# Patient Record
Sex: Female | Born: 1955 | Race: Black or African American | Hispanic: No | Marital: Single | Smoking: Never smoker
Health system: Southern US, Community
[De-identification: ages and names within clinical notes are randomized; demographics above are authoritative.]

## PROBLEM LIST (undated history)

## (undated) DIAGNOSIS — E119 Type 2 diabetes mellitus without complications: Secondary | ICD-10-CM

## (undated) DIAGNOSIS — IMO0002 Reserved for concepts with insufficient information to code with codable children: Secondary | ICD-10-CM

## (undated) DIAGNOSIS — N950 Postmenopausal bleeding: Secondary | ICD-10-CM

## (undated) DIAGNOSIS — N840 Polyp of corpus uteri: Secondary | ICD-10-CM

## (undated) DIAGNOSIS — M549 Dorsalgia, unspecified: Secondary | ICD-10-CM

## (undated) DIAGNOSIS — I10 Essential (primary) hypertension: Secondary | ICD-10-CM

## (undated) DIAGNOSIS — K08109 Complete loss of teeth, unspecified cause, unspecified class: Secondary | ICD-10-CM

## (undated) DIAGNOSIS — K219 Gastro-esophageal reflux disease without esophagitis: Secondary | ICD-10-CM

## (undated) DIAGNOSIS — E785 Hyperlipidemia, unspecified: Secondary | ICD-10-CM

## (undated) DIAGNOSIS — Z973 Presence of spectacles and contact lenses: Secondary | ICD-10-CM

## (undated) DIAGNOSIS — G8929 Other chronic pain: Secondary | ICD-10-CM

## (undated) DIAGNOSIS — M1A9XX0 Chronic gout, unspecified, without tophus (tophi): Secondary | ICD-10-CM

## (undated) DIAGNOSIS — M1990 Unspecified osteoarthritis, unspecified site: Secondary | ICD-10-CM

## (undated) DIAGNOSIS — E669 Obesity, unspecified: Secondary | ICD-10-CM

## (undated) HISTORY — PX: CHOLECYSTECTOMY: SHX55

## (undated) HISTORY — PX: MANDIBLE FRACTURE SURGERY: SHX706

## (undated) HISTORY — PX: TONSILLECTOMY: SUR1361

## (undated) HISTORY — PX: HERNIA REPAIR: SHX51

---

## 1976-09-11 HISTORY — PX: TONSILLECTOMY: SUR1361

## 1980-09-11 HISTORY — PX: CHOLECYSTECTOMY OPEN: SUR202

## 1983-09-12 HISTORY — PX: MANDIBLE FRACTURE SURGERY: SHX706

## 1998-03-16 ENCOUNTER — Other Ambulatory Visit: Admission: RE | Admit: 1998-03-16 | Discharge: 1998-03-16 | Payer: Self-pay | Admitting: Gynecology

## 1998-03-18 ENCOUNTER — Other Ambulatory Visit: Admission: RE | Admit: 1998-03-18 | Discharge: 1998-03-18 | Payer: Self-pay | Admitting: Gynecology

## 2000-01-18 ENCOUNTER — Other Ambulatory Visit: Admission: RE | Admit: 2000-01-18 | Discharge: 2000-01-18 | Payer: Self-pay | Admitting: Gynecology

## 2001-01-10 ENCOUNTER — Other Ambulatory Visit: Admission: RE | Admit: 2001-01-10 | Discharge: 2001-01-10 | Payer: Self-pay | Admitting: Gynecology

## 2001-07-30 ENCOUNTER — Ambulatory Visit (HOSPITAL_COMMUNITY): Admission: RE | Admit: 2001-07-30 | Discharge: 2001-07-30 | Payer: Self-pay | Admitting: *Deleted

## 2001-07-30 ENCOUNTER — Encounter: Payer: Self-pay | Admitting: *Deleted

## 2001-08-07 ENCOUNTER — Inpatient Hospital Stay (HOSPITAL_COMMUNITY): Admission: AD | Admit: 2001-08-07 | Discharge: 2001-08-07 | Payer: Self-pay | Admitting: *Deleted

## 2001-08-15 ENCOUNTER — Inpatient Hospital Stay (HOSPITAL_COMMUNITY): Admission: AD | Admit: 2001-08-15 | Discharge: 2001-08-15 | Payer: Self-pay | Admitting: *Deleted

## 2001-08-16 ENCOUNTER — Inpatient Hospital Stay (HOSPITAL_COMMUNITY): Admission: AD | Admit: 2001-08-16 | Discharge: 2001-08-19 | Payer: Self-pay | Admitting: *Deleted

## 2001-08-16 ENCOUNTER — Encounter (INDEPENDENT_AMBULATORY_CARE_PROVIDER_SITE_OTHER): Payer: Self-pay

## 2001-12-19 ENCOUNTER — Other Ambulatory Visit: Admission: RE | Admit: 2001-12-19 | Discharge: 2001-12-19 | Payer: Self-pay | Admitting: *Deleted

## 2002-02-17 ENCOUNTER — Emergency Department (HOSPITAL_COMMUNITY): Admission: EM | Admit: 2002-02-17 | Discharge: 2002-02-17 | Payer: Self-pay | Admitting: Emergency Medicine

## 2002-12-24 ENCOUNTER — Other Ambulatory Visit: Admission: RE | Admit: 2002-12-24 | Discharge: 2002-12-24 | Payer: Self-pay | Admitting: *Deleted

## 2003-02-05 ENCOUNTER — Ambulatory Visit: Admission: RE | Admit: 2003-02-05 | Discharge: 2003-02-05 | Payer: Self-pay | Admitting: Family Medicine

## 2003-12-03 ENCOUNTER — Emergency Department (HOSPITAL_COMMUNITY): Admission: EM | Admit: 2003-12-03 | Discharge: 2003-12-03 | Payer: Self-pay | Admitting: Emergency Medicine

## 2004-01-07 ENCOUNTER — Other Ambulatory Visit: Admission: RE | Admit: 2004-01-07 | Discharge: 2004-01-07 | Payer: Self-pay | Admitting: Gynecology

## 2006-08-27 ENCOUNTER — Ambulatory Visit: Payer: Self-pay | Admitting: Gastroenterology

## 2007-06-24 ENCOUNTER — Encounter: Admission: RE | Admit: 2007-06-24 | Discharge: 2007-06-24 | Payer: Self-pay | Admitting: Family Medicine

## 2007-10-18 ENCOUNTER — Encounter: Payer: Self-pay | Admitting: Family Medicine

## 2007-10-18 ENCOUNTER — Ambulatory Visit (HOSPITAL_COMMUNITY): Admission: RE | Admit: 2007-10-18 | Discharge: 2007-10-18 | Payer: Self-pay | Admitting: Family Medicine

## 2008-03-25 ENCOUNTER — Emergency Department (HOSPITAL_COMMUNITY): Admission: EM | Admit: 2008-03-25 | Discharge: 2008-03-25 | Payer: Self-pay | Admitting: Emergency Medicine

## 2008-05-19 ENCOUNTER — Ambulatory Visit: Payer: Self-pay | Admitting: Family Medicine

## 2008-05-19 LAB — CONVERTED CEMR LAB
ALT: 28 units/L (ref 0–35)
AST: 34 units/L (ref 0–37)
Basophils Absolute: 0 10*3/uL (ref 0.0–0.1)
Basophils Relative: 0 % (ref 0–1)
Creatinine, Ser: 0.54 mg/dL (ref 0.40–1.20)
Eosinophils Relative: 3 % (ref 0–5)
Free T4: 1 ng/dL (ref 0.89–1.80)
HCT: 32.8 % — ABNORMAL LOW (ref 36.0–46.0)
Hemoglobin: 9.6 g/dL — ABNORMAL LOW (ref 12.0–15.0)
LDL Cholesterol: 55 mg/dL (ref 0–99)
MCHC: 29.3 g/dL — ABNORMAL LOW (ref 30.0–36.0)
Monocytes Absolute: 0.4 10*3/uL (ref 0.1–1.0)
Neutro Abs: 5.4 10*3/uL (ref 1.7–7.7)
Platelets: 438 10*3/uL — ABNORMAL HIGH (ref 150–400)
RDW: 17.1 % — ABNORMAL HIGH (ref 11.5–15.5)
TSH: 1.624 microintl units/mL (ref 0.350–4.50)
Total Bilirubin: 0.3 mg/dL (ref 0.3–1.2)
Total CHOL/HDL Ratio: 4.1
VLDL: 47 mg/dL — ABNORMAL HIGH (ref 0–40)

## 2008-05-21 ENCOUNTER — Ambulatory Visit (HOSPITAL_COMMUNITY): Admission: RE | Admit: 2008-05-21 | Discharge: 2008-05-21 | Payer: Self-pay | Admitting: Family Medicine

## 2008-05-27 ENCOUNTER — Ambulatory Visit: Payer: Self-pay | Admitting: *Deleted

## 2008-05-27 ENCOUNTER — Ambulatory Visit: Payer: Self-pay | Admitting: Family Medicine

## 2008-07-09 ENCOUNTER — Emergency Department (HOSPITAL_COMMUNITY): Admission: EM | Admit: 2008-07-09 | Discharge: 2008-07-09 | Payer: Self-pay | Admitting: Family Medicine

## 2008-07-16 ENCOUNTER — Ambulatory Visit: Payer: Self-pay | Admitting: Family Medicine

## 2008-07-30 ENCOUNTER — Ambulatory Visit (HOSPITAL_COMMUNITY): Admission: RE | Admit: 2008-07-30 | Discharge: 2008-08-01 | Payer: Self-pay | Admitting: General Surgery

## 2008-07-30 HISTORY — PX: LAPAROSCOPIC ASSISTED VENTRAL HERNIA REPAIR: SHX6312

## 2008-11-02 ENCOUNTER — Emergency Department (HOSPITAL_COMMUNITY): Admission: EM | Admit: 2008-11-02 | Discharge: 2008-11-02 | Payer: Self-pay | Admitting: Emergency Medicine

## 2009-01-08 ENCOUNTER — Ambulatory Visit: Payer: Self-pay | Admitting: Family Medicine

## 2009-04-01 ENCOUNTER — Ambulatory Visit: Payer: Self-pay | Admitting: Internal Medicine

## 2009-07-22 ENCOUNTER — Ambulatory Visit: Payer: Self-pay | Admitting: Family Medicine

## 2009-11-27 ENCOUNTER — Emergency Department (HOSPITAL_COMMUNITY): Admission: EM | Admit: 2009-11-27 | Discharge: 2009-11-27 | Payer: Self-pay | Admitting: Family Medicine

## 2009-12-08 ENCOUNTER — Emergency Department (HOSPITAL_COMMUNITY): Admission: EM | Admit: 2009-12-08 | Discharge: 2009-12-08 | Payer: Self-pay | Admitting: Emergency Medicine

## 2009-12-23 ENCOUNTER — Ambulatory Visit: Payer: Self-pay | Admitting: Family Medicine

## 2010-04-12 ENCOUNTER — Ambulatory Visit: Payer: Self-pay | Admitting: Family Medicine

## 2010-04-12 LAB — CONVERTED CEMR LAB
ALT: 17 units/L (ref 0–35)
Basophils Absolute: 0 10*3/uL (ref 0.0–0.1)
CO2: 26 meq/L (ref 19–32)
Cholesterol: 145 mg/dL (ref 0–200)
Eosinophils Relative: 3 % (ref 0–5)
HCT: 41.3 % (ref 36.0–46.0)
LDL Cholesterol: 77 mg/dL (ref 0–99)
Lymphocytes Relative: 25 % (ref 12–46)
Neutro Abs: 5.7 10*3/uL (ref 1.7–7.7)
Neutrophils Relative %: 67 % (ref 43–77)
Platelets: 331 10*3/uL (ref 150–400)
RDW: 15.5 % (ref 11.5–15.5)
Sodium: 139 meq/L (ref 135–145)
Total Bilirubin: 0.3 mg/dL (ref 0.3–1.2)
Total Protein: 6.7 g/dL (ref 6.0–8.3)
VLDL: 29 mg/dL (ref 0–40)

## 2010-04-26 ENCOUNTER — Ambulatory Visit: Payer: Self-pay | Admitting: Family Medicine

## 2010-05-20 ENCOUNTER — Ambulatory Visit (HOSPITAL_COMMUNITY): Admission: RE | Admit: 2010-05-20 | Discharge: 2010-05-20 | Payer: Self-pay | Admitting: Internal Medicine

## 2010-07-10 ENCOUNTER — Emergency Department (HOSPITAL_COMMUNITY): Admission: EM | Admit: 2010-07-10 | Discharge: 2010-07-10 | Payer: Self-pay | Admitting: Family Medicine

## 2010-07-25 ENCOUNTER — Emergency Department (HOSPITAL_COMMUNITY): Admission: EM | Admit: 2010-07-25 | Discharge: 2010-07-25 | Payer: Self-pay | Admitting: Emergency Medicine

## 2010-08-02 ENCOUNTER — Ambulatory Visit (HOSPITAL_COMMUNITY): Admission: RE | Admit: 2010-08-02 | Discharge: 2010-08-02 | Payer: Self-pay | Admitting: Family Medicine

## 2010-10-13 ENCOUNTER — Encounter (INDEPENDENT_AMBULATORY_CARE_PROVIDER_SITE_OTHER): Payer: Self-pay | Admitting: Family Medicine

## 2010-12-27 LAB — POCT URINALYSIS DIP (DEVICE)
Glucose, UA: NEGATIVE mg/dL
Hgb urine dipstick: NEGATIVE
Nitrite: NEGATIVE

## 2011-01-24 NOTE — Op Note (Signed)
Tracy Byrd, Tracy Byrd                  ACCOUNT NO.:  [id number]   MEDICAL RECORD NO.:  [id number]          PATIENT TYPE:  OIB   LOCATION:  1532                         FACILITY:  [facility]   PHYSICIAN:  Adolph Pollack, M.D.DATE OF BIRTH:  1956-03-03   DATE OF PROCEDURE:  07/30/2008  DATE OF DISCHARGE:                               OPERATIVE REPORT   PREOPERATIVE DIAGNOSIS:  Ventral hernia.   POSTOPERATIVE DIAGNOSIS:  Ventral hernia.   PROCEDURE:  Laparoscopic ventral hernia repair with mesh.   SURGEON:  Edwin Cap. Zoila Shutter, M.D.   ASSISTANT:  Anselm Pancoast. Zachery Dakins, M.D.   ANESTHESIA:  General.   INDICATIONS:  This 55 year old female has had a right subcostal incision  for cholecystectomy in the past as well as a lower midline incision.  She had developed some abdominal discomfort in the epigastric region  with bloating.  CT scan was performed which demonstrated a significant  sized ventral hernia.  It is palpable as well.  She now presents for  repair.   TECHNIQUE:  She was seen in the holding area and then brought to the  operating room, placed supine on the operating table and general  anesthetic was administered.  A Foley catheter was inserted and the  abdominal wall was widely prepped and draped.  In the left upper  quadrant a small incision was made and using a 5 mm OptiView trocar I  gained access to the peritoneal cavity and created a pneumoperitoneum.  Following this I inspected the area directly under the trocar and saw no  evidence of bleeding, solid organ injury or hollow organ injury.  I  identified the hernia and noted that part of the omentum was up in the  hernia.  A 5 mm trocar was then placed in the left lateral mid abdomen.  Using the Harmonic scalpel and blunt dissection I reduced the omentum  out of the hernia.  I then did dissect some adhesions between the right  upper quadrant incisional area using Harmonic scalpel and partially  divided the falciform  ligament.  A 10 mm trocar was then placed in the  right upper quadrant and a 5 mm in the right mid lateral abdomen.   The periphery of the mesh was measured with a spinal needle and then I  measured 4 cm away from this to have adequate overlap.  I brought a  piece of 15 cm x 20 cm Parietex mesh with a nonadherent barrier into the  field and placed four stay sutures at the four quadrants of the mesh of  #1 Novofil.  The mesh was hydrated, rolled up and then placed in the  abdominal cavity and then deployed with the nonadherent side facing the  viscera.  At the 12, 3, 6 and 9 o'clock positions I made stab incisions  and then brought up the stay sutures across fascial bridges and tied  them down initially anchoring the mesh to the abdominal wall.  Using the  spiral tacker I further anchored the mesh to the abdominal wall with an  outer rim and an inner rim of  tacks.  This provided for adequate  coverage of the defect with good overlap.   I then inspected all quadrants of the area and there was no bleeding or  evidence of visceral injury.  I removed the trocars and released the  CO2.  All skin incisions were closed with 4-0 Monocryl subcuticular  stitches.  Steri-Strips and sterile dressings were applied.  She  tolerated the procedure without any apparent complications and was taken  to the recovery room in satisfactory condition.      Adolph Pollack, M.D.  Electronically Signed     TJR/MEDQ  D:  07/30/2008  T:  07/30/2008  Job:  [id number]

## 2011-01-27 NOTE — Op Note (Signed)
[facility]  Patient:    Tracy Byrd, Tracy Byrd Visit Number: [id number] MRN: [id number]          Service Type: OBS Location: 910A 9199 01 Attending Physician:  Pleas Koch Dictated by:   Georgina Peer, M.D. Proc. Date: 08/16/01 Admit Date:  08/16/2001                             Operative Report  PREOPERATIVE DIAGNOSES:       1. Intrauterine pregnancy at 40-1/2 weeks.                               2. Spontaneous rupture of membranes with                                  meconium-stained fluid.                               3. Macrosomia.                               4. Nonreassuring fetal heart rate tracing.  POSTOPERATIVE DIAGNOSES:      1. Intrauterine pregnancy at 40-1/2 weeks.                               2. Spontaneous rupture of membranes with                                  meconium-stained fluid.                               3. Macrosomia.                               4. Nonreassuring fetal heart rate tracing.                               5. Low-lying posterior placenta.  OPERATION PERFORMED:          Primary low cervical transverse cesarean section.  SURGEON:                      Georgina Peer, M.D.  ANESTHESIA:                   Spinal.  ANESTHESIOLOGIST:             Raul Del, M.D.  BLOOD LOSS:                   1200 cc.  COMPLICATIONS:                None.  FINDINGS:                     A female infant born at 9:44.  Thin meconium-stained fluid, with some meconium below the cords.  Apgars were 5 at one minute and 9 at five minutes.  The tubes an ovaries were normal.  A fundal fibroid was noted.  INDICATIONS FOR PROCEDURE:    This 55 year old gravida 1, para 0 with an EDC of August 10, 2001 presented with rupture of membranes at approximately 6:45 a.m. with meconium-staine fluid.  She began contracting.  She was monitored in maternity admissions and note to have deep variable decelerations down to  30 beats per minute, which did not respond to positional change and oxygen administration.  The patient was fingertip dilated with the vertex unengaged. It was decided to perform a cesarean section for nonreassuring fetal heart rate tracing.  The patient was informed of the risks and complications of surgery including blood loss, infection, bowel or bladder injury, uterine injury, blood clots.  She accepted these and was willing to proceed.  DESCRIPTION OF PROCEDURE:     She was taken to the operating room, given a spinal anesthetic by Dr. Tacy Dura, prepped and draped in the normal sterile fashion.  A Foley catheter was placed in her bladder.  Urine was clear.  After adequate anesthesia was established, a transverse incision was made.  The peritoneal cavity was entered in a normal Pfannenstiel manner.  A bladder flap was made transversely and a bladder blade placed over the bladder flap.  A transverse uterine incision was made and the uterine cavity was entered. meconium-stained fluid which was not particulate (but was not real thin, either) was noted.  Bandage scissors widened the incision.  With fundal pressure, a female infant was delivered at 9:44.  The mouth and nose were suctioned.  A DeLee trap was present.  The infant started to cry.  Therefore, the cord was doubly clamped and cut.  The infant was handed to the pediatric team in attendance.  Cord blood was taken, as well as arterial cord pH.  The placenta was noted to be posterior and low in the lower uterine segment, with the fetal head resting on the placenta.  That may be the etiology of the fetal deceleration.  The placental membranes were removed.  The uterus was closed in two layers of running lock Vicryl with an imbricating suture of Vicryl. Bleeders were controlled with individual sutures of Vicryl.  The tubes an ovaries were inspected and found to be normal.  The pelvis was irrigated and all debris was removed.  The  underside of the peritoneum and muscles were inspected and any bleeders cauterized.  The fascia was closed with #1 Vicryl. The subcutaneous tissue was closed with 2-0 plain gut.  The skin was closed with skin staples.  The patient did receive one dose of Penicillin G for positive group B strep prior to the procedure, and also 1 g of Ancef after the cord was clamped.  Sponge, needle and instrument counts were correct.  The patient was transferred to the recovery area in stable condition.  Dictated by: Georgina Peer, M.D. Attending Physician:  Pleas Koch DD:  08/16/01 TD:  08/16/01 Job: [id number]

## 2011-01-27 NOTE — Discharge Summary (Signed)
[facility]  Patient:    Tracy Byrd, Tracy Byrd Visit Number: [id number] MRN: [id number]          Service Type: OBS Location: 910A 9113 01 Attending Physician:  Pleas Koch Dictated by:   Georgina Peer, M.D. Admit Date:  08/16/2001 Discharge Date: 08/19/2001                             Discharge Summary  ADMISSION DIAGNOSES: 1. Term intrauterine pregnancy. 2. Macrosomic infant. 3. Spontaneous rupture of membranes. 4. Non-reassuring fetal heart rate tracing.  DISCHARGE DIAGNOSES: 1. Term intrauterine pregnancy. 2. Macrosomic infant. 3. Spontaneous rupture of membranes. 4. Non-reassuring fetal heart rate tracing. 5. Anemia. 6. Female infant delivered.  HISTORY OF PRESENT ILLNESS:  The patient is a 55 year old, gravida 1, para 0, estimated date of confinement 08/10/01, who presented with spontaneous rupture of membranes in the early morning of 08/16/01.  Meconium stained fluid.  Fetal heart rate severe variable decelerations were noted in maternity admissions. It was not thought the infant was tolerating labor.  Corrective measures, including IV hydration, position changes, and oxygen did not correct the deep variables, and she was taken for cesarean section.  See cesarean section note for details.  HOSPITAL COURSE:  At the time of cesarean section, a female infant was born, 10 pounds 6 ounces.  Cord pH 7.22 arterial with Apgars of 5 and 9.  Thin meconium stained fluid was noted.  Some was noted below the cord which was suctioned by the neonatology team.  Blood loss was 1200 cc.  The patient otherwise did well.  Postoperatively, the patient did well.  Her blood pressure was 148/74.  She had adequate urine output, and a hemoglobin was 9.9 from preoperatively of 12.8.  She ambulated well.  There was no dizziness or lightheadedness.  Platelet count was 215,000.  On the second day she had a bowel movement and passed gas.  She was afebrile with  a blood pressure of 152/66.  The wound was clean and dry.  A repeat CBC showed a hemoglobin of 9.2.  She was not dizzy or light-headed.  Her blood pressure was 140/70.  Her wound was clean and dry.  Significant pedal edema had decreased somewhat, but was still 3+.  Reflexes were normal.  Abdomen was soft and flat.  She was voiding without difficulty.  She tolerated solids.  She was placed on Percocet p.o. for pain as well as Motrin, and on the third day she was ready for discharge.  She was discharged home with a discharge booklet, discharge instructions, prescriptions for Percocet.  FOLLOWUP:  In the office in about 4 days for staple removal.  She will also start iron b.i.d.  We will discuss contraception in the office.  ACTIVITY:  She will avoid heavy lifting, driving, and tub baths. Dictated by:   Georgina Peer, M.D. Attending Physician:  Pleas Koch DD:  08/19/01 TD:  08/19/01 Job: [id number]

## 2011-01-27 NOTE — Assessment & Plan Note (Signed)
[facility]                         GASTROENTEROLOGY OFFICE NOTE   NAME:Uliano, TITA TERHAAR                         MRN:          [id number]  DATE:08/27/2006                            DOB:          03-15-1956    REASON FOR CONSULTATION:  Indigestion.   Ms. Decoursey is a 55 year old African-American female here for evaluation  of indigestion.  She has been complaining of postprandial abdominal  bloating, mild discomfort and fullness in her abdomen.  She feels there  is a hardened area in the umbilical area after eating.  She has had some  vomiting which has since subsided.  She takes Naprosyn intermittently.  There has been no change in her medications.  She denies change of bowel  habits, melena, or hematochezia.  She was seen three years ago for heme  positive stool.  Colonoscopy was recommended but this was not done.   PAST MEDICAL HISTORY:  Pertinent for hypertension and arthritis.  She is  status post cholecystectomy and C-section.   FAMILY HISTORY:  Pertinent for a sister with stomach cancer and a  brother with esophageal cancer.   MEDICATIONS:  Include Benicar, Naprosyn, iron, estrogen, and oral  contraceptive.  She has no allergies.   She neither smokes nor drinks.  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   REVIEW OF SYSTEMS:  Positive for feet swelling, joint pains, night  sweats, anxiety, and shortness of breath.   PHYSICAL EXAMINATION:  GENERAL:  She is a healthy-appearing female.  VITAL SIGNS:  Pulse 80, blood pressure 126/84, weight 253.  HEENT: EOMI. PERRLA. Sclerae are anicteric.  Conjunctivae are pink.  NECK:  Supple without thyromegaly, adenopathy or carotid bruits.  CHEST:  Clear to auscultation and percussion without adventitious  sounds.  CARDIAC:  Regular rhythm; normal S1 S2.  There are no murmurs, gallops  or rubs.  ABDOMEN:  She has mild left lower quadrant tenderness without guarding  or rebound.  There were no abdominal  masses or organomegaly.  Bowel  sounds are normoactive.  EXTREMITIES:  Full range of motion.  No cyanosis, clubbing or edema.  RECTAL:  Deferred.   IMPRESSION:  1. Nonspecific dyspepsia - possibilities include active ulcer disease      or nonulcer dyspepsia.  2. History of heme positive stool.   RECOMMENDATION:  1. Trial of Zantac 150 mg b.i.d.  2. Upper endoscopy.  3. Colonoscopy (to be done at the same time as her upper endoscopy).     Barbette Hair. Arlyce Dice, MD,FACG  Electronically Signed    RDK/MedQ  DD: 08/27/2006  DT: 08/27/2006  Job #: [id number]   cc:   Onalee Hua L. Reed Breech, M.D.

## 2011-02-21 ENCOUNTER — Ambulatory Visit: Payer: Self-pay | Attending: Family Medicine | Admitting: Rehabilitative and Restorative Service Providers"

## 2011-02-21 DIAGNOSIS — R293 Abnormal posture: Secondary | ICD-10-CM | POA: Insufficient documentation

## 2011-02-21 DIAGNOSIS — M2569 Stiffness of other specified joint, not elsewhere classified: Secondary | ICD-10-CM | POA: Insufficient documentation

## 2011-02-21 DIAGNOSIS — M546 Pain in thoracic spine: Secondary | ICD-10-CM | POA: Insufficient documentation

## 2011-02-21 DIAGNOSIS — IMO0001 Reserved for inherently not codable concepts without codable children: Secondary | ICD-10-CM | POA: Insufficient documentation

## 2011-02-27 ENCOUNTER — Ambulatory Visit: Payer: Self-pay | Admitting: Physical Therapy

## 2011-03-02 ENCOUNTER — Ambulatory Visit: Payer: Self-pay | Admitting: Physical Therapy

## 2011-03-07 ENCOUNTER — Encounter: Payer: Self-pay | Admitting: Physical Therapy

## 2011-03-08 ENCOUNTER — Ambulatory Visit: Payer: Self-pay | Admitting: Physical Therapy

## 2011-03-09 ENCOUNTER — Ambulatory Visit: Payer: Self-pay | Admitting: Physical Therapy

## 2011-03-10 ENCOUNTER — Other Ambulatory Visit (HOSPITAL_COMMUNITY): Payer: Self-pay | Admitting: Family Medicine

## 2011-03-10 ENCOUNTER — Ambulatory Visit (HOSPITAL_COMMUNITY)
Admission: RE | Admit: 2011-03-10 | Discharge: 2011-03-10 | Disposition: A | Discharge status: Home / Self Care | Payer: Self-pay | Source: Ambulatory Visit | Attending: Family Medicine | Admitting: Family Medicine

## 2011-03-10 DIAGNOSIS — M5134 Other intervertebral disc degeneration, thoracic region: Secondary | ICD-10-CM

## 2011-03-10 DIAGNOSIS — R209 Unspecified disturbances of skin sensation: Secondary | ICD-10-CM | POA: Insufficient documentation

## 2011-03-10 DIAGNOSIS — M79609 Pain in unspecified limb: Secondary | ICD-10-CM | POA: Insufficient documentation

## 2011-03-10 DIAGNOSIS — M545 Low back pain: Secondary | ICD-10-CM

## 2011-03-10 DIAGNOSIS — M5137 Other intervertebral disc degeneration, lumbosacral region: Secondary | ICD-10-CM | POA: Insufficient documentation

## 2011-03-10 DIAGNOSIS — M25552 Pain in left hip: Secondary | ICD-10-CM

## 2011-03-10 DIAGNOSIS — M25559 Pain in unspecified hip: Secondary | ICD-10-CM | POA: Insufficient documentation

## 2011-03-10 DIAGNOSIS — M51379 Other intervertebral disc degeneration, lumbosacral region without mention of lumbar back pain or lower extremity pain: Secondary | ICD-10-CM | POA: Insufficient documentation

## 2011-03-21 ENCOUNTER — Ambulatory Visit: Payer: Self-pay | Attending: Family Medicine | Admitting: Physical Therapy

## 2011-03-21 DIAGNOSIS — M546 Pain in thoracic spine: Secondary | ICD-10-CM | POA: Insufficient documentation

## 2011-03-21 DIAGNOSIS — R293 Abnormal posture: Secondary | ICD-10-CM | POA: Insufficient documentation

## 2011-03-21 DIAGNOSIS — IMO0001 Reserved for inherently not codable concepts without codable children: Secondary | ICD-10-CM | POA: Insufficient documentation

## 2011-03-23 ENCOUNTER — Encounter: Payer: Self-pay | Admitting: Physical Therapy

## 2011-03-27 ENCOUNTER — Encounter: Payer: Self-pay | Admitting: Physical Therapy

## 2011-06-09 LAB — URINALYSIS, ROUTINE W REFLEX MICROSCOPIC
Nitrite: NEGATIVE
Specific Gravity, Urine: 1.018
Urobilinogen, UA: 1

## 2011-06-13 LAB — DIFFERENTIAL
Eosinophils Relative: 2
Lymphs Abs: 2.5
Monocytes Absolute: 0.6
Neutro Abs: 6

## 2011-06-13 LAB — COMPREHENSIVE METABOLIC PANEL
ALT: 33
AST: 35
Albumin: 3.4 — ABNORMAL LOW
CO2: 31
Chloride: 96
GFR calc Af Amer: >60
GFR calc non Af Amer: >60
Sodium: 136
Total Bilirubin: 0.7

## 2011-06-13 LAB — GLUCOSE, CAPILLARY
Glucose-Capillary: 119 — ABNORMAL HIGH
Glucose-Capillary: 121 — ABNORMAL HIGH
Glucose-Capillary: 130 — ABNORMAL HIGH
Glucose-Capillary: 140 — ABNORMAL HIGH
Glucose-Capillary: 142 — ABNORMAL HIGH
Glucose-Capillary: 158 — ABNORMAL HIGH
Glucose-Capillary: 163 — ABNORMAL HIGH
Glucose-Capillary: 163 — ABNORMAL HIGH

## 2011-06-13 LAB — CBC
RBC: 4.5
WBC: 9.4

## 2011-06-13 LAB — ABO/RH: ABO/RH(D): A POS

## 2011-06-13 LAB — PROTIME-INR
INR: 1
Prothrombin Time: 13.6

## 2011-06-30 ENCOUNTER — Ambulatory Visit: Payer: Self-pay | Admitting: Obstetrics and Gynecology

## 2011-10-04 ENCOUNTER — Emergency Department (INDEPENDENT_AMBULATORY_CARE_PROVIDER_SITE_OTHER)
Admission: EM | Admit: 2011-10-04 | Discharge: 2011-10-04 | Disposition: A | Discharge status: Home / Self Care | Payer: Self-pay | Source: Home / Self Care

## 2011-10-04 ENCOUNTER — Emergency Department (INDEPENDENT_AMBULATORY_CARE_PROVIDER_SITE_OTHER): Payer: Self-pay

## 2011-10-04 ENCOUNTER — Encounter (HOSPITAL_COMMUNITY): Payer: Self-pay

## 2011-10-04 DIAGNOSIS — J209 Acute bronchitis, unspecified: Secondary | ICD-10-CM

## 2011-10-04 HISTORY — DX: Essential (primary) hypertension: I10

## 2011-10-04 MED ORDER — PREDNISONE 20 MG PO TABS: 20.0000 mg | ORAL_TABLET | Freq: Two times a day (BID) | ORAL | Status: AC

## 2011-10-04 MED ORDER — BENZONATATE 100 MG PO CAPS: ORAL_CAPSULE | ORAL | Status: AC

## 2011-10-04 MED ORDER — ALBUTEROL SULFATE HFA 108 (90 BASE) MCG/ACT IN AERS
2.0000 | INHALATION_SPRAY | RESPIRATORY_TRACT | Status: DC | PRN
  Administered 2011-10-04: 2 via RESPIRATORY_TRACT

## 2011-10-04 MED ORDER — ALBUTEROL SULFATE HFA 108 (90 BASE) MCG/ACT IN AERS
INHALATION_SPRAY | RESPIRATORY_TRACT | Status: AC
  Filled 2011-10-04: qty 6.7

## 2011-10-04 MED ORDER — CEPHALEXIN 500 MG PO CAPS: 500.0000 mg | ORAL_CAPSULE | Freq: Three times a day (TID) | ORAL | Status: AC

## 2011-10-04 NOTE — ED Notes (Signed)
States she has been having cough since 07-28-2011, unable to shake it off; has been beige tinted, blood tinted today; minimal relief w OTC medications

## 2011-10-04 NOTE — ED Provider Notes (Signed)
Medical screening examination/treatment/procedure(s) were performed by non-physician practitioner and as supervising physician I was immediately available for consultation/collaboration.  LANEY,RONNIE   Ronnie Laney, MD 10/04/11 2111 

## 2011-10-04 NOTE — ED Provider Notes (Signed)
History     CSN: [id number]  Arrival date & time 10/04/11  1410   None     Chief Complaint  Patient presents with  . Cough    (Consider location/radiation/quality/duration/timing/severity/associated sxs/prior treatment) HPI Comments: Patients presents today with c/o cough x 2 months. Though cough has been waxing and waning but does not completely go away. Cough is mostly nonproductive but is occasionally productive with light colored sputum and today has been blood streaked. No shortness of breath or wheezing.    Past Medical History  Diagnosis Date  . Hypertension     Past Surgical History  Procedure Date  . Cholecystectomy   . Hernia repair   . Cesarean section     History reviewed. No pertinent family history.  History  Substance Use Topics  . Smoking status: Never Smoker   . Smokeless tobacco: Not on file  . Alcohol Use: No    OB History    Grav Para Term Preterm Abortions TAB SAB Ect Mult Living                  Review of Systems  Constitutional: Negative for fever, chills and fatigue.  HENT: Positive for congestion. Negative for ear pain, sore throat, rhinorrhea, sneezing, postnasal drip and sinus pressure.   Respiratory: Positive for cough. Negative for shortness of breath and wheezing.   Cardiovascular: Negative for chest pain and palpitations.    Allergies  Review of patient's allergies indicates no known allergies.  Home Medications   Current Outpatient Rx  Name Route Sig Dispense Refill  . BENZONATATE 100 MG PO CAPS  1-2 caps every 8 hrs prn cough 30 capsule 0  . CEPHALEXIN 500 MG PO CAPS Oral Take 1 capsule (500 mg total) by mouth 3 (three) times daily. 30 capsule 0  . PREDNISONE 20 MG PO TABS Oral Take 1 tablet (20 mg total) by mouth 2 (two) times daily. 10 tablet 0    BP 147/68  Pulse 87  Temp(Src) 98.4 F (36.9 C) (Oral)  Resp 20  SpO2 97%  LMP 08/26/2011  Physical Exam  Nursing note and vitals reviewed. Constitutional: She  appears well-developed and well-nourished. No distress.  HENT:  Head: Normocephalic and atraumatic.  Right Ear: Tympanic membrane, external ear and ear canal normal.  Left Ear: Tympanic membrane, external ear and ear canal normal.  Nose: Nose normal.  Mouth/Throat: Uvula is midline, oropharynx is clear and moist and mucous membranes are normal. No oropharyngeal exudate, posterior oropharyngeal edema or posterior oropharyngeal erythema.  Neck: Neck supple.  Cardiovascular: Normal rate, regular rhythm and normal heart sounds.   Pulmonary/Chest: Effort normal and breath sounds normal. No respiratory distress.  Lymphadenopathy:    She has no cervical adenopathy.  Neurological: She is alert.  Skin: Skin is warm and dry.  Psychiatric: She has a normal mood and affect.    ED Course  Procedures (including critical care time)  Labs Reviewed - No data to display Dg Chest 2 View  10/04/2011  *RADIOLOGY REPORT*  Clinical Data: Chronic cough, congestion  CHEST - 2 VIEW  Comparison: 07/28/2008  Findings: Lungs are essentially clear. No pleural effusion or pneumothorax.  Cardiomediastinal silhouette is within normal limits.  Degenerative changes of the visualized thoracolumbar spine.  IMPRESSION: No evidence of acute cardiopulmonary disease.  Original Report Authenticated By: Charline Bills, M.D.     1. Acute bronchitis       MDM  CXR neg. Cough - reportedly x 2 mos. Exam neg.  Melody Comas, [place] 10/04/11 1705

## 2011-10-10 ENCOUNTER — Encounter (HOSPITAL_COMMUNITY): Payer: Self-pay | Admitting: Emergency Medicine

## 2011-10-10 ENCOUNTER — Emergency Department (INDEPENDENT_AMBULATORY_CARE_PROVIDER_SITE_OTHER)
Admission: EM | Admit: 2011-10-10 | Discharge: 2011-10-10 | Disposition: A | Discharge status: Home / Self Care | Payer: Self-pay | Source: Home / Self Care | Attending: Family Medicine | Admitting: Family Medicine

## 2011-10-10 DIAGNOSIS — T07XXXA Unspecified multiple injuries, initial encounter: Secondary | ICD-10-CM

## 2011-10-10 HISTORY — DX: Other chronic pain: G89.29

## 2011-10-10 HISTORY — DX: Dorsalgia, unspecified: M54.9

## 2011-10-10 HISTORY — DX: Reserved for concepts with insufficient information to code with codable children: IMO0002

## 2011-10-10 MED ORDER — HYDROCODONE-ACETAMINOPHEN 5-500 MG PO TABS: 1.0000 | ORAL_TABLET | Freq: Four times a day (QID) | ORAL | Status: AC | PRN

## 2011-10-10 NOTE — ED Notes (Signed)
C/o back pain, hip pain, thigh pain, knee pain, reports fall saturday.  Tripped over a floor mat per patient

## 2011-10-10 NOTE — ED Provider Notes (Signed)
History     CSN: [id number]  Arrival date & time 10/10/11  4098   First MD Initiated Contact with Patient 10/10/11 0825      No chief complaint on file.   (Consider location/radiation/quality/duration/timing/severity/associated sxs/prior treatment) Patient is a 56 y.o. female presenting with fall. The history is provided by the patient.  Fall The accident occurred more than 2 days ago. The fall occurred while walking (tripped over mat in laundry matt, sl sore left thigh at the time, resolved, then soreness developed yest.). She landed on a hard floor. There was no blood loss. The pain is present in the left knee, left hip and left wrist. The pain is mild. She was ambulatory at the scene. The symptoms are aggravated by ambulation.    Past Medical History  Diagnosis Date  . Hypertension     Past Surgical History  Procedure Date  . Cholecystectomy   . Hernia repair   . Cesarean section     No family history on file.  History  Substance Use Topics  . Smoking status: Never Smoker   . Smokeless tobacco: Not on file  . Alcohol Use: No    OB History    Grav Para Term Preterm Abortions TAB SAB Ect Mult Living                  Review of Systems  Constitutional: Negative.   Gastrointestinal: Negative.   Musculoskeletal: Positive for arthralgias and gait problem. Negative for back pain and joint swelling.  Neurological: Negative.     Allergies  Review of patient's allergies indicates no known allergies.  Home Medications   Current Outpatient Rx  Name Route Sig Dispense Refill  . BENZONATATE 100 MG PO CAPS  1-2 caps every 8 hrs prn cough 30 capsule 0  . CEPHALEXIN 500 MG PO CAPS Oral Take 1 capsule (500 mg total) by mouth 3 (three) times daily. 30 capsule 0  . HYDROCODONE-ACETAMINOPHEN 5-500 MG PO TABS Oral Take 1-2 tablets by mouth every 6 (six) hours as needed for pain. 15 tablet 0  . PREDNISONE 20 MG PO TABS Oral Take 1 tablet (20 mg total) by mouth 2 (two) times  daily. 10 tablet 0    BP 165/80  Pulse 74  Temp(Src) 98.4 F (36.9 C) (Oral)  Resp 16  SpO2 97%  LMP 08/26/2011  Physical Exam  Nursing note and vitals reviewed. Constitutional: She is oriented to person, place, and time. She appears well-developed and well-nourished.  HENT:  Head: Normocephalic and atraumatic.  Neck: Normal range of motion. Neck supple.  Musculoskeletal: Normal range of motion. She exhibits tenderness.       Legs: Neurological: She is alert and oriented to person, place, and time.  Skin: Skin is warm and dry.       No bruises or abrasions    ED Course  Procedures (including critical care time)  Labs Reviewed - No data to display No results found.   1. Contusion of multiple sites       MDM          Barkley Bruns, MD 10/10/11 [phone]

## 2012-04-11 ENCOUNTER — Encounter (HOSPITAL_COMMUNITY): Payer: Self-pay | Admitting: Cardiology

## 2012-04-11 ENCOUNTER — Emergency Department (HOSPITAL_COMMUNITY)
Admission: EM | Admit: 2012-04-11 | Discharge: 2012-04-11 | Disposition: A | Discharge status: Home / Self Care | Payer: Self-pay | Attending: Emergency Medicine | Admitting: Emergency Medicine

## 2012-04-11 ENCOUNTER — Emergency Department (HOSPITAL_COMMUNITY): Payer: Self-pay

## 2012-04-11 DIAGNOSIS — IMO0002 Reserved for concepts with insufficient information to code with codable children: Secondary | ICD-10-CM | POA: Insufficient documentation

## 2012-04-11 DIAGNOSIS — M1711 Unilateral primary osteoarthritis, right knee: Secondary | ICD-10-CM

## 2012-04-11 DIAGNOSIS — G8929 Other chronic pain: Secondary | ICD-10-CM | POA: Insufficient documentation

## 2012-04-11 DIAGNOSIS — M1710 Unilateral primary osteoarthritis, unspecified knee: Secondary | ICD-10-CM | POA: Insufficient documentation

## 2012-04-11 DIAGNOSIS — I10 Essential (primary) hypertension: Secondary | ICD-10-CM | POA: Insufficient documentation

## 2012-04-11 MED ORDER — OXYCODONE-ACETAMINOPHEN 5-325 MG PO TABS: 1.0000 | ORAL_TABLET | Freq: Four times a day (QID) | ORAL | Status: AC | PRN

## 2012-04-11 NOTE — ED Notes (Signed)
Pt reports right knee and foot pain that started a couple of days ago. Denies any injury to the area.

## 2012-04-11 NOTE — ED Provider Notes (Signed)
History     CSN: [id number]  Arrival date & time 04/11/12  0816   First MD Initiated Contact with Patient 04/11/12 [phone]      Chief Complaint  Patient presents with  . Knee Pain    (Consider location/radiation/quality/duration/timing/severity/associated sxs/prior treatment) Patient is a 56 y.o. female presenting with knee pain. The history is provided by the patient.  Knee Pain This is a new problem. Episode onset: 3 days ago. The problem occurs constantly. The problem has been rapidly worsening. The symptoms are aggravated by walking. Nothing relieves the symptoms. Treatments tried: nsaids.    Past Medical History  Diagnosis Date  . Hypertension   . Back pain, chronic   . Degenerative disc disease     Past Surgical History  Procedure Date  . Cholecystectomy   . Hernia repair   . Cesarean section     History reviewed. No pertinent family history.  History  Substance Use Topics  . Smoking status: Never Smoker   . Smokeless tobacco: Not on file  . Alcohol Use: No    OB History    Grav Para Term Preterm Abortions TAB SAB Ect Mult Living                  Review of Systems  All other systems reviewed and are negative.    Allergies  Review of patient's allergies indicates no known allergies.  Home Medications   Current Outpatient Rx  Name Route Sig Dispense Refill  . CYCLOBENZAPRINE HCL 10 MG PO TABS Oral Take 10 mg by mouth at bedtime as needed. For pain    . GABAPENTIN 100 MG PO CAPS Oral Take 100 mg by mouth every evening.     . IBUPROFEN 800 MG PO TABS Oral Take 800 mg by mouth every 8 (eight) hours as needed. For pain    . LISINOPRIL-HYDROCHLOROTHIAZIDE 20-25 MG PO TABS Oral Take 1 tablet by mouth daily.    Marland Kitchen NAPROXEN 500 MG PO TABS Oral Take 500 mg by mouth 2 (two) times daily with a meal. Per MD, do not take with Ibuprofen    . TETRAHYDROZOLINE HCL 0.05 % OP SOLN Both Eyes Place 2 drops into both eyes daily as needed. For dry eyes or irritation       BP 142/87  Pulse 82  Temp 97.9 F (36.6 C) (Oral)  Resp 18  SpO2 100%  Physical Exam  Nursing note and vitals reviewed. Constitutional: She is oriented to person, place, and time. She appears well-developed and well-nourished. No distress.  HENT:  Head: Normocephalic and atraumatic.  Neck: Normal range of motion. Neck supple.  Musculoskeletal:       There is a mild to moderate effusion present.  There is pain with range of motion which makes exam difficult.  No warmth or erythema.  Neurological: She is alert and oriented to person, place, and time.  Skin: Skin is warm and dry. She is not diaphoretic.    ED Course  Procedures (including critical care time)  Labs Reviewed - No data to display No results found.   No diagnosis found.    MDM  The xrays show djd.  She will be treated with prednisone and pain meds.  To return prn if she worsens and follow up with pcp if not improving.        Geoffery Lyons, MD 04/11/12 1050

## 2012-08-19 ENCOUNTER — Encounter (HOSPITAL_COMMUNITY): Payer: Self-pay

## 2012-08-19 ENCOUNTER — Emergency Department (HOSPITAL_COMMUNITY)
Admission: EM | Admit: 2012-08-19 | Discharge: 2012-08-19 | Disposition: A | Discharge status: Home / Self Care | Payer: No Typology Code available for payment source | Source: Home / Self Care

## 2012-08-19 DIAGNOSIS — E119 Type 2 diabetes mellitus without complications: Secondary | ICD-10-CM

## 2012-08-19 DIAGNOSIS — M1990 Unspecified osteoarthritis, unspecified site: Secondary | ICD-10-CM

## 2012-08-19 DIAGNOSIS — I10 Essential (primary) hypertension: Secondary | ICD-10-CM

## 2012-08-19 DIAGNOSIS — M25569 Pain in unspecified knee: Secondary | ICD-10-CM

## 2012-08-19 LAB — LIPID PANEL
LDL Cholesterol: 87 mg/dL (ref 0–99)
Total CHOL/HDL Ratio: 4.4 RATIO
VLDL: 35 mg/dL (ref 0–40)

## 2012-08-19 LAB — HEMOGLOBIN A1C
Hgb A1c MFr Bld: 7 % — ABNORMAL HIGH (ref <5.7)
Mean Plasma Glucose: 154 mg/dL — ABNORMAL HIGH (ref <117)

## 2012-08-19 LAB — COMPREHENSIVE METABOLIC PANEL
ALT: 22 U/L (ref 0–35)
Albumin: 4 g/dL (ref 3.5–5.2)
Alkaline Phosphatase: 52 U/L (ref 39–117)
GFR calc Af Amer: >90 mL/min (ref >90)
Glucose, Bld: 113 mg/dL — ABNORMAL HIGH (ref 70–99)
Potassium: 3.6 mEq/L (ref 3.5–5.1)
Sodium: 137 mEq/L (ref 135–145)
Total Protein: 8.2 g/dL (ref 6.0–8.3)

## 2012-08-19 MED ORDER — LISINOPRIL-HYDROCHLOROTHIAZIDE 20-25 MG PO TABS: 1.0000 | ORAL_TABLET | Freq: Every day | ORAL | Status: AC

## 2012-08-19 MED ORDER — NAPROXEN 500 MG PO TABS: 500.0000 mg | ORAL_TABLET | Freq: Two times a day (BID) | ORAL | Status: DC

## 2012-08-19 MED ORDER — METFORMIN HCL 500 MG PO TABS: 500.0000 mg | ORAL_TABLET | Freq: Every day | ORAL | Status: DC

## 2012-08-19 MED ORDER — CYCLOBENZAPRINE HCL 10 MG PO TABS: 10.0000 mg | ORAL_TABLET | Freq: Every evening | ORAL | Status: DC | PRN

## 2012-08-19 MED ORDER — GABAPENTIN 100 MG PO CAPS: 100.0000 mg | ORAL_CAPSULE | Freq: Every evening | ORAL | Status: DC

## 2012-08-19 NOTE — ED Provider Notes (Signed)
History     CSN: [id number]  Arrival date & time 08/19/12  1036   None    Chief Complaint  Patient presents with  . Knee Pain  . Hip Pain  . Medication Refill    (Consider location/radiation/quality/duration/timing/severity/associated sxs/prior treatment) HPI Pt says she is out of her BP medications.  She reports that she otherwise has been doing well.  She has no complaints today.  She reports that she has had some difficulty checking her blood glucose because of not having testing strips.  She otherwise reports that she is taking her medications with no difficulties at this time.  She says that she has been without her gabapentin for the last several days.  Past Medical History  Diagnosis Date  . Hypertension   . Back pain, chronic   . Degenerative disc disease     Past Surgical History  Procedure Date  . Cholecystectomy   . Hernia repair   . Cesarean section     No family history on file.  History  Substance Use Topics  . Smoking status: Never Smoker   . Smokeless tobacco: Not on file  . Alcohol Use: No    OB History    Grav Para Term Preterm Abortions TAB SAB Ect Mult Living                  Review of Systems  Constitutional: Negative.   HENT: Negative.   Eyes: Positive for visual disturbance. Negative for pain, redness and itching.  Respiratory: Negative.   Cardiovascular: Negative.   Gastrointestinal: Negative.   Genitourinary: Negative.   Musculoskeletal: Negative.   Neurological: Negative.   Hematological: Negative.   Psychiatric/Behavioral: Negative.     Allergies  Review of patient's allergies indicates no known allergies.  Home Medications   Current Outpatient Rx  Name  Route  Sig  Dispense  Refill  . IBUPROFEN 800 MG PO TABS   Oral   Take 800 mg by mouth every 8 (eight) hours as needed. For pain         . METFORMIN HCL 500 MG PO TABS   Oral   Take 500 mg by mouth once. Pt states takes only 1/2 pill daily         . NAPROXEN  500 MG PO TABS   Oral   Take 500 mg by mouth 2 (two) times daily with a meal. Per MD, do not take with Ibuprofen         . CYCLOBENZAPRINE HCL 10 MG PO TABS   Oral   Take 10 mg by mouth at bedtime as needed. For pain         . GABAPENTIN 100 MG PO CAPS   Oral   Take 100 mg by mouth every evening.          Marland Kitchen LISINOPRIL-HYDROCHLOROTHIAZIDE 20-25 MG PO TABS   Oral   Take 1 tablet by mouth daily.         . TETRAHYDROZOLINE HCL 0.05 % OP SOLN   Both Eyes   Place 2 drops into both eyes daily as needed. For dry eyes or irritation           BP 108/56  Pulse 93  Temp 98.2 F (36.8 C) (Oral)  Resp 20  SpO2 100%  LMP 08/07/2012  Physical Exam  Nursing note and vitals reviewed. Constitutional: She is oriented to person, place, and time. She appears well-developed and well-nourished. No distress.  HENT:  Head: Normocephalic and atraumatic.  Eyes: EOM are normal. Pupils are equal, round, and reactive to light.  Neck: Normal range of motion. Neck supple. No JVD present. No thyromegaly present.  Cardiovascular: Normal rate, regular rhythm and normal heart sounds.   Pulmonary/Chest: Effort normal and breath sounds normal.  Abdominal: Soft. Bowel sounds are normal.  Musculoskeletal: Normal range of motion. She exhibits no edema and no tenderness.  Neurological: She is alert and oriented to person, place, and time. She has normal reflexes.  Skin: Skin is warm and dry. No rash noted. No erythema.  Psychiatric: She has a normal mood and affect. Her behavior is normal. Judgment and thought content normal.    ED Course  Procedures (including critical care time)  Labs Reviewed - No data to display No results found.   No diagnosis found.    MDM  IMPRESSION  Hypertension  Type 2 diabetes  Knee pain - chronic  Osteoarthritis  Chronic back pain  Diabetic neuropathy  RECOMMENDATIONS / PLAN Declined flu vaccine Refilled all of her blood pressure and diabetes  medications. Follow labs.  FOLLOW UP 3 months for regular check or sooner if necessary  The patient was given clear instructions to go to ER or return to medical center if symptoms don't improve, worsen or new problems develop.  The patient verbalized understanding.  The patient was told to call to get lab results if they haven't heard anything in the next week.           Cleora Fleet, MD 08/19/12 [phone]

## 2012-08-19 NOTE — ED Notes (Signed)
[facility]. Also c/o knee and hip pain

## 2012-08-19 NOTE — ED Notes (Deleted)
Patient states has been having abd pain since last February.  Denies diarrhea. Burning sensation when having bowel movement.  Stomach hurts after eating.  States presently taking omeprazole

## 2012-08-21 ENCOUNTER — Telehealth (HOSPITAL_COMMUNITY): Payer: Self-pay

## 2012-08-21 NOTE — Telephone Encounter (Signed)
Message copied by Lestine Mount on Wed Aug 21, 2012  2:14 PM ------      Message from: Cleora Fleet      Created: Tue Aug 20, 2012  8:12 PM       Please notify patient that labs came back OK and stable.  A1c is 7.0%.  Continue current meds and recheck in 3 months.              Rodney Langton, MD, CDE, FAAFP      [facility]      [facility]      [place], [place]

## 2012-12-31 ENCOUNTER — Ambulatory Visit (HOSPITAL_COMMUNITY)
Admission: RE | Admit: 2012-12-31 | Discharge: 2012-12-31 | Disposition: A | Discharge status: Home / Self Care | Payer: No Typology Code available for payment source | Source: Ambulatory Visit | Attending: Family Medicine | Admitting: Family Medicine

## 2012-12-31 ENCOUNTER — Other Ambulatory Visit (HOSPITAL_COMMUNITY): Payer: Self-pay | Admitting: Family Medicine

## 2012-12-31 DIAGNOSIS — M238X9 Other internal derangements of unspecified knee: Secondary | ICD-10-CM | POA: Insufficient documentation

## 2012-12-31 DIAGNOSIS — M25569 Pain in unspecified knee: Secondary | ICD-10-CM | POA: Insufficient documentation

## 2012-12-31 DIAGNOSIS — R52 Pain, unspecified: Secondary | ICD-10-CM

## 2013-02-06 ENCOUNTER — Encounter (HOSPITAL_COMMUNITY): Payer: Self-pay | Admitting: *Deleted

## 2013-02-06 ENCOUNTER — Emergency Department (HOSPITAL_COMMUNITY)
Admission: EM | Admit: 2013-02-06 | Discharge: 2013-02-06 | Disposition: A | Discharge status: Home / Self Care | Payer: Self-pay | Attending: Emergency Medicine | Admitting: Emergency Medicine

## 2013-02-06 DIAGNOSIS — Z79899 Other long term (current) drug therapy: Secondary | ICD-10-CM | POA: Insufficient documentation

## 2013-02-06 DIAGNOSIS — Z9089 Acquired absence of other organs: Secondary | ICD-10-CM | POA: Insufficient documentation

## 2013-02-06 DIAGNOSIS — M5432 Sciatica, left side: Secondary | ICD-10-CM

## 2013-02-06 DIAGNOSIS — M545 Low back pain, unspecified: Secondary | ICD-10-CM | POA: Insufficient documentation

## 2013-02-06 DIAGNOSIS — Z8739 Personal history of other diseases of the musculoskeletal system and connective tissue: Secondary | ICD-10-CM | POA: Insufficient documentation

## 2013-02-06 DIAGNOSIS — I10 Essential (primary) hypertension: Secondary | ICD-10-CM | POA: Insufficient documentation

## 2013-02-06 DIAGNOSIS — G8929 Other chronic pain: Secondary | ICD-10-CM | POA: Insufficient documentation

## 2013-02-06 DIAGNOSIS — M5430 Sciatica, unspecified side: Secondary | ICD-10-CM | POA: Insufficient documentation

## 2013-02-06 LAB — URINALYSIS, ROUTINE W REFLEX MICROSCOPIC
Bilirubin Urine: NEGATIVE
Ketones, ur: NEGATIVE mg/dL
Nitrite: NEGATIVE
Protein, ur: NEGATIVE mg/dL
Urobilinogen, UA: 1 mg/dL (ref 0.0–1.0)

## 2013-02-06 MED ORDER — ONDANSETRON 4 MG PO TBDP
4.0000 mg | ORAL_TABLET | Freq: Once | ORAL | Status: AC
  Administered 2013-02-06: 4 mg via ORAL

## 2013-02-06 MED ORDER — HYDROMORPHONE HCL PF 1 MG/ML IJ SOLN
1.0000 mg | Freq: Once | INTRAMUSCULAR | Status: AC
  Administered 2013-02-06: 1 mg via INTRAMUSCULAR
  Filled 2013-02-06: qty 1

## 2013-02-06 MED ORDER — ONDANSETRON 4 MG PO TBDP
ORAL_TABLET | ORAL | Status: AC
  Administered 2013-02-06: 4 mg
  Filled 2013-02-06: qty 1

## 2013-02-06 MED ORDER — PROMETHAZINE HCL 25 MG/ML IJ SOLN
25.0000 mg | Freq: Once | INTRAMUSCULAR | Status: DC
  Filled 2013-02-06 (×2): qty 1

## 2013-02-06 MED ORDER — KETOROLAC TROMETHAMINE 60 MG/2ML IM SOLN
60.0000 mg | Freq: Once | INTRAMUSCULAR | Status: AC
  Administered 2013-02-06: 60 mg via INTRAMUSCULAR
  Filled 2013-02-06: qty 2

## 2013-02-06 MED ORDER — HYDROCODONE-ACETAMINOPHEN 5-325 MG PO TABS: 2.0000 | ORAL_TABLET | ORAL | Status: DC | PRN

## 2013-02-06 MED ORDER — DIAZEPAM 5 MG PO TABS: 5.0000 mg | ORAL_TABLET | Freq: Two times a day (BID) | ORAL | Status: DC

## 2013-02-06 NOTE — ED Notes (Signed)
Pain improved after IM injection, but reports nausea. PA made aware and given zofran ODT for relief.

## 2013-02-06 NOTE — ED Notes (Signed)
Pt vomited x 1- PA made aware and order received for phenergren.

## 2013-02-06 NOTE — ED Provider Notes (Signed)
Medical screening examination/treatment/procedure(s) were performed by non-physician practitioner and as supervising physician I was immediately available for consultation/collaboration.  Brelan Hannen M Capone Schwinn, MD 02/06/13 2228 

## 2013-02-06 NOTE — ED Notes (Signed)
Pt reports left flank/back pain radiating to left leg. Reports hx of back problems. Denies abdominal pain or difficulty urinating.

## 2013-02-06 NOTE — ED Provider Notes (Signed)
History     CSN: [id number]  Arrival date & time 02/06/13  4098   First MD Initiated Contact with Patient 02/06/13 [phone]      Chief Complaint  Patient presents with  . Flank Pain    (Consider location/radiation/quality/duration/timing/severity/associated sxs/prior treatment) Patient is a 57 y.o. female presenting with back pain. The history is provided by the patient. No language interpreter was used.  Back Pain Location:  Lumbar spine Quality:  Aching Radiates to:  L posterior upper leg Pain severity:  Moderate Pain is:  Same all the time Onset quality:  Gradual Timing:  Constant Context: not falling and not MVA   Relieved by:  Nothing Worsened by:  Nothing tried Ineffective treatments:  Ibuprofen and muscle relaxants Associated symptoms: no abdominal pain    Pt reports she has degenerative disc disease in her low back.   Pt reports she has worsening pain with pain going down her left leg. Past Medical History  Diagnosis Date  . Hypertension   . Back pain, chronic   . Degenerative disc disease     Past Surgical History  Procedure Laterality Date  . Cholecystectomy    . Hernia repair    . Cesarean section      No family history on file.  History  Substance Use Topics  . Smoking status: Never Smoker   . Smokeless tobacco: Not on file  . Alcohol Use: No    OB History   Grav Para Term Preterm Abortions TAB SAB Ect Mult Living                  Review of Systems  Gastrointestinal: Negative for abdominal pain.  Musculoskeletal: Positive for back pain.  All other systems reviewed and are negative.    Allergies  Tramadol  Home Medications   Current Outpatient Rx  Name  Route  Sig  Dispense  Refill  . cyclobenzaprine (FLEXERIL) 10 MG tablet   Oral   Take 1 tablet (10 mg total) by mouth at bedtime as needed. For pain   30 tablet   3   . gabapentin (NEURONTIN) 100 MG capsule   Oral   Take 1 capsule (100 mg total) by mouth every evening.   90  capsule   3   . ibuprofen (ADVIL,MOTRIN) 800 MG tablet   Oral   Take 800 mg by mouth every 8 (eight) hours as needed for pain.         Marland Kitchen lisinopril-hydrochlorothiazide (PRINZIDE,ZESTORETIC) 20-25 MG per tablet   Oral   Take 1 tablet by mouth daily.   30 tablet   3   . metFORMIN (GLUCOPHAGE) 500 MG tablet   Oral   Take 1 tablet (500 mg total) by mouth daily with breakfast. Pt states takes only 1/2 pill daily   30 tablet   3   . naproxen (NAPROSYN) 500 MG tablet   Oral   Take 500 mg by mouth 2 (two) times daily as needed. For knee pain           BP 145/75  Pulse 88  Temp(Src) 98.2 F (36.8 C) (Oral)  Resp 18  SpO2 98%  LMP 08/07/2012  Physical Exam  Nursing note and vitals reviewed. Constitutional: She is oriented to person, place, and time. She appears well-developed and well-nourished.  HENT:  Head: Normocephalic.  Right Ear: External ear normal.  Left Ear: External ear normal.  Nose: Nose normal.  Mouth/Throat: Oropharynx is clear and moist.  Eyes: Conjunctivae and EOM  are normal. Pupils are equal, round, and reactive to light.  Neck: Normal range of motion.  Cardiovascular: Normal rate and normal heart sounds.   Pulmonary/Chest: Effort normal and breath sounds normal.  Abdominal: Soft.  Musculoskeletal:  Tender left lower back,  Tender to left buttock  Neurological: She is alert and oriented to person, place, and time. She has normal reflexes.  Skin: Skin is warm.  Psychiatric: She has a normal mood and affect.    ED Course  Procedures (including critical care time)  Labs Reviewed - No data to display No results found.   1. Sciatica, left       MDM    Results for orders placed during the hospital encounter of 02/06/13  URINALYSIS, ROUTINE W REFLEX MICROSCOPIC      Result Value Range   Color, Urine YELLOW  YELLOW   APPearance CLEAR  CLEAR   Specific Gravity, Urine 1.015  1.005 - 1.030   pH 5.0  5.0 - 8.0   Glucose, UA NEGATIVE  NEGATIVE  mg/dL   Hgb urine dipstick TRACE (*) NEGATIVE   Bilirubin Urine NEGATIVE  NEGATIVE   Ketones, ur NEGATIVE  NEGATIVE mg/dL   Protein, ur NEGATIVE  NEGATIVE mg/dL   Urobilinogen, UA 1.0  0.0 - 1.0 mg/dL   Nitrite NEGATIVE  NEGATIVE   Leukocytes, UA NEGATIVE  NEGATIVE  URINE MICROSCOPIC-ADD ON      Result Value Range   Squamous Epithelial / LPF RARE  RARE   WBC, UA 0-2  <3 WBC/hpf   RBC / HPF 0-2  <3 RBC/hpf   Bacteria, UA FEW (*) RARE   No results found.     Pt had nausea and vomiting after medications  Pt given odt zofran.  I suspect sciatica.   Pt advised to follow up with her primary MD for recheck  Elson Areas, PA-C 02/06/13 1241  Lonia Skinner [place], [place] 02/06/13 1241

## 2013-02-11 ENCOUNTER — Other Ambulatory Visit (HOSPITAL_COMMUNITY): Payer: Self-pay | Admitting: Family Medicine

## 2013-02-11 DIAGNOSIS — Z1231 Encounter for screening mammogram for malignant neoplasm of breast: Secondary | ICD-10-CM

## 2013-02-13 ENCOUNTER — Ambulatory Visit (HOSPITAL_COMMUNITY): Payer: No Typology Code available for payment source

## 2013-03-04 ENCOUNTER — Ambulatory Visit (HOSPITAL_COMMUNITY): Payer: No Typology Code available for payment source | Attending: Family Medicine

## 2013-06-07 ENCOUNTER — Encounter (HOSPITAL_COMMUNITY): Payer: Self-pay | Admitting: Emergency Medicine

## 2013-06-07 ENCOUNTER — Emergency Department (HOSPITAL_COMMUNITY)
Admission: EM | Admit: 2013-06-07 | Discharge: 2013-06-07 | Disposition: A | Discharge status: Home / Self Care | Payer: No Typology Code available for payment source | Attending: Emergency Medicine | Admitting: Emergency Medicine

## 2013-06-07 DIAGNOSIS — IMO0002 Reserved for concepts with insufficient information to code with codable children: Secondary | ICD-10-CM | POA: Insufficient documentation

## 2013-06-07 DIAGNOSIS — I10 Essential (primary) hypertension: Secondary | ICD-10-CM | POA: Insufficient documentation

## 2013-06-07 DIAGNOSIS — E119 Type 2 diabetes mellitus without complications: Secondary | ICD-10-CM | POA: Insufficient documentation

## 2013-06-07 DIAGNOSIS — Z79899 Other long term (current) drug therapy: Secondary | ICD-10-CM | POA: Insufficient documentation

## 2013-06-07 DIAGNOSIS — M546 Pain in thoracic spine: Secondary | ICD-10-CM | POA: Insufficient documentation

## 2013-06-07 DIAGNOSIS — M79609 Pain in unspecified limb: Secondary | ICD-10-CM | POA: Insufficient documentation

## 2013-06-07 DIAGNOSIS — E669 Obesity, unspecified: Secondary | ICD-10-CM | POA: Insufficient documentation

## 2013-06-07 DIAGNOSIS — Z888 Allergy status to other drugs, medicaments and biological substances status: Secondary | ICD-10-CM | POA: Insufficient documentation

## 2013-06-07 DIAGNOSIS — G8929 Other chronic pain: Secondary | ICD-10-CM | POA: Insufficient documentation

## 2013-06-07 HISTORY — DX: Obesity, unspecified: E66.9

## 2013-06-07 HISTORY — DX: Type 2 diabetes mellitus without complications: E11.9

## 2013-06-07 MED ORDER — HYDROCODONE-ACETAMINOPHEN 5-325 MG PO TABS: ORAL_TABLET | ORAL | Status: DC

## 2013-06-07 MED ORDER — MORPHINE SULFATE 4 MG/ML IJ SOLN
4.0000 mg | Freq: Once | INTRAMUSCULAR | Status: AC
  Administered 2013-06-07: 4 mg via INTRAMUSCULAR
  Filled 2013-06-07: qty 1

## 2013-06-07 MED ORDER — PROMETHAZINE HCL 25 MG PO TABS: 25.0000 mg | ORAL_TABLET | Freq: Four times a day (QID) | ORAL | Status: DC | PRN

## 2013-06-07 MED ORDER — ONDANSETRON 4 MG PO TBDP
4.0000 mg | ORAL_TABLET | Freq: Once | ORAL | Status: AC
  Administered 2013-06-07: 4 mg via ORAL
  Filled 2013-06-07: qty 1

## 2013-06-07 NOTE — ED Notes (Signed)
Pt. reports right back pain onset Tuesday , denies injury or fall , pt. stated history of degenerative disck disease and arthritis .

## 2013-06-07 NOTE — ED Provider Notes (Signed)
CSN: [id number]     Arrival date & time 06/07/13  2047 History  This chart was scribed for non-physician practitioner Wynetta Emery, PA-C working with Gwyneth Sprout, MD by Danella Maiers, ED Scribe. This patient was seen in room TR11C/TR11C and the patient's care was started at 8:55 PM.   Chief Complaint  Patient presents with  . Back Pain   Patient is a 57 y.o. female presenting with back pain. The history is provided by the patient. No language interpreter was used.  Back Pain  HPI Comments: Tracy Byrd is a 57 y.o. female with a history of degenerative disc disease, arthritis, and chronic back pain who presents to the Emergency Department complaining of right-sided thoracic back pain with left leg involvement onset 4 days ago. She states when she got out of the car the pain radiated down the back of her left leg. She is taking ibuprofen at home with no relief. She states the pain is made worse by walking. She rates her pain as a 10/10 severity. She went to the doctor 3 days ago and was told that her pain is secondary to her degenerative disc disease. She has not seen an orthopedist. She denies any injuries or falls. She denies fever, urinary or bowel problems. She denies h/o cancer. She denies h/o IV drug use.  Past Medical History  Diagnosis Date  . Hypertension   . Back pain, chronic   . Degenerative disc disease    Past Surgical History  Procedure Laterality Date  . Cholecystectomy    . Hernia repair    . Cesarean section     No family history on file. History  Substance Use Topics  . Smoking status: Never Smoker   . Smokeless tobacco: Not on file  . Alcohol Use: No   OB History   Grav Para Term Preterm Abortions TAB SAB Ect Mult Living                 Review of Systems  Musculoskeletal: Positive for back pain.  A complete 10 system review of systems was obtained and all systems are negative except as noted in the HPI and PMH.   Allergies  Tramadol  Home  Medications   Current Outpatient Rx  Name  Route  Sig  Dispense  Refill  . cyclobenzaprine (FLEXERIL) 10 MG tablet   Oral   Take 1 tablet (10 mg total) by mouth at bedtime as needed. For pain   30 tablet   3   . diazepam (VALIUM) 5 MG tablet   Oral   Take 1 tablet (5 mg total) by mouth 2 (two) times daily.   10 tablet   0   . gabapentin (NEURONTIN) 100 MG capsule   Oral   Take 1 capsule (100 mg total) by mouth every evening.   90 capsule   3   . HYDROcodone-acetaminophen (NORCO/VICODIN) 5-325 MG per tablet   Oral   Take 2 tablets by mouth every 4 (four) hours as needed for pain.   20 tablet   0   . ibuprofen (ADVIL,MOTRIN) 800 MG tablet   Oral   Take 800 mg by mouth every 8 (eight) hours as needed for pain.         Marland Kitchen lisinopril-hydrochlorothiazide (PRINZIDE,ZESTORETIC) 20-25 MG per tablet   Oral   Take 1 tablet by mouth daily.   30 tablet   3   . metFORMIN (GLUCOPHAGE) 500 MG tablet   Oral   Take 1 tablet (  500 mg total) by mouth daily with breakfast. Pt states takes only 1/2 pill daily   30 tablet   3   . naproxen (NAPROSYN) 500 MG tablet   Oral   Take 500 mg by mouth 2 (two) times daily as needed. For knee pain          BP 151/66  Pulse 87  Temp(Src) 98.3 F (36.8 C) (Oral)  Resp 16  SpO2 98%  LMP 08/07/2012 Physical Exam  Nursing note and vitals reviewed. Constitutional: She is oriented to person, place, and time. She appears well-developed and well-nourished. No distress.  HENT:  Head: Normocephalic.  Eyes: Conjunctivae and EOM are normal.  Cardiovascular: Normal rate.   Pulmonary/Chest: Effort normal. No stridor.  Musculoskeletal: Normal range of motion.  Strength is 5 out of 5 to bilateral lower extremities at hip and knee, extensor hallucis longus 5 out of 5. Ankle strength 5 out of 5, no clonus, neurovascularly intact.   Neurological: She is alert and oriented to person, place, and time.  Psychiatric: She has a normal mood and affect.     ED Course  Procedures (including critical care time) Medications - No data to display  DIAGNOSTIC STUDIES: Oxygen Saturation is 98% on RA, normal by my interpretation.    COORDINATION OF CARE: 9:10 PM- Discussed treatment plan with pt which includes treatment with pain meds and referral to orthopedist and pt agrees to plan.    Labs Review Labs Reviewed - No data to display Imaging Review No results found.  MDM   1. Exacerbation of chronic back pain     Filed Vitals:   06/07/13 2050 06/07/13 2156  BP: 151/66 143/71  Pulse: 87 77  Temp: 98.3 F (36.8 C) 98.4 F (36.9 C)  TempSrc: Oral Oral  Resp: 16 16  SpO2: 98% 97%     Tracy Byrd is a 57 y.o. female Patient with back pain.  No neurological deficits and normal neuro exam.  Patient can walk but states is painful.  No loss of bowel or bladder control.  No concern for cauda equina.  No fever, night sweats, weight loss, h/o cancer, IVDU.  RICE protocol and pain medicine indicated and discussed with patient.    Medications  morphine 4 MG/ML injection 4 mg (4 mg Intramuscular Given 06/07/13 2126)  ondansetron (ZOFRAN-ODT) disintegrating tablet 4 mg (4 mg Oral Given 06/07/13 2126)    Pt is hemodynamically stable, appropriate for, and amenable to discharge at this time. Pt verbalized understanding and agrees with care plan. All questions answered. Outpatient follow-up and specific return precautions discussed.    Discharge Medication List as of 06/07/2013  9:18 PM    START taking these medications   Details  !! HYDROcodone-acetaminophen (NORCO/VICODIN) 5-325 MG per tablet Take 1-2 tablets by mouth every 6 hours as needed for pain., Print    promethazine (PHENERGAN) 25 MG tablet Take 1 tablet (25 mg total) by mouth every 6 (six) hours as needed for nausea., Starting 06/07/2013, Until Discontinued, Print     !! - Potential duplicate medications found. Please discuss with provider.      Note: Portions of this report  may have been transcribed using voice recognition software. Every effort was made to ensure accuracy; however, inadvertent computerized transcription errors may be present     Wynetta Emery, PA-C 06/08/13 2207

## 2013-06-07 NOTE — ED Notes (Signed)
Pt has ride home.

## 2013-06-08 NOTE — ED Provider Notes (Signed)
Medical screening examination/treatment/procedure(s) were performed by non-physician practitioner and as supervising physician I was immediately available for consultation/collaboration.   Maekayla Giorgio, MD 06/08/13 2328 

## 2013-08-16 ENCOUNTER — Emergency Department (HOSPITAL_COMMUNITY)
Admission: EM | Admit: 2013-08-16 | Discharge: 2013-08-16 | Disposition: A | Discharge status: Home / Self Care | Payer: Self-pay | Attending: Emergency Medicine | Admitting: Emergency Medicine

## 2013-08-16 ENCOUNTER — Encounter (HOSPITAL_COMMUNITY): Payer: Self-pay | Admitting: Emergency Medicine

## 2013-08-16 DIAGNOSIS — M545 Low back pain, unspecified: Secondary | ICD-10-CM | POA: Insufficient documentation

## 2013-08-16 DIAGNOSIS — G8929 Other chronic pain: Secondary | ICD-10-CM | POA: Insufficient documentation

## 2013-08-16 DIAGNOSIS — M549 Dorsalgia, unspecified: Secondary | ICD-10-CM

## 2013-08-16 DIAGNOSIS — Z79899 Other long term (current) drug therapy: Secondary | ICD-10-CM | POA: Insufficient documentation

## 2013-08-16 DIAGNOSIS — I10 Essential (primary) hypertension: Secondary | ICD-10-CM | POA: Insufficient documentation

## 2013-08-16 DIAGNOSIS — E119 Type 2 diabetes mellitus without complications: Secondary | ICD-10-CM | POA: Insufficient documentation

## 2013-08-16 DIAGNOSIS — E669 Obesity, unspecified: Secondary | ICD-10-CM | POA: Insufficient documentation

## 2013-08-16 MED ORDER — HYDROCODONE-ACETAMINOPHEN 5-325 MG PO TABS: 1.0000 | ORAL_TABLET | Freq: Four times a day (QID) | ORAL | Status: DC | PRN

## 2013-08-16 NOTE — ED Provider Notes (Signed)
CSN: [id number]     Arrival date & time 08/16/13  4098 History   First MD Initiated Contact with Patient 08/16/13 0900     Chief Complaint  Patient presents with  . Back Pain   (Consider location/radiation/quality/duration/timing/severity/associated sxs/prior Treatment) HPI Comments: Patient with a history of Degenerative Disc Disease and chronic back pain presents today with a chief complaint of lower back pain.  She reports that the pain has been worse over the past 3 weeks.  Pain similar to the pain that she has had in the past.  Pain radiates down her left leg.  She states that she has also been diagnosed with Sciatica in the past.  She denies acute injury or trauma.  She has taken Ibuprofen for the pain without relief.  She states that she has an appointment scheduled with her PCP next week.  She denies urinary symptoms, urinary incontinence, or bowel incontinence.  She denies numbness or tingling.  Denies fever or chills.  Denies abdominal pain.    Patient is a 57 y.o. female presenting with back pain. The history is provided by the patient.  Back Pain   Past Medical History  Diagnosis Date  . Hypertension   . Back pain, chronic   . Degenerative disc disease   . Diabetes mellitus without complication   . Obesity    Past Surgical History  Procedure Laterality Date  . Cholecystectomy    . Hernia repair    . Cesarean section    . Tonsillectomy     No family history on file. History  Substance Use Topics  . Smoking status: Never Smoker   . Smokeless tobacco: Not on file  . Alcohol Use: No   OB History   Grav Para Term Preterm Abortions TAB SAB Ect Mult Living                 Review of Systems  Musculoskeletal: Positive for back pain.  All other systems reviewed and are negative.    Allergies  Tramadol  Home Medications   Current Outpatient Rx  Name  Route  Sig  Dispense  Refill  . cyclobenzaprine (FLEXERIL) 10 MG tablet   Oral   Take 1 tablet (10 mg total) by  mouth at bedtime as needed. For pain   30 tablet   3   . ibuprofen (ADVIL,MOTRIN) 800 MG tablet   Oral   Take 800 mg by mouth every 8 (eight) hours as needed for pain.         Marland Kitchen lisinopril-hydrochlorothiazide (PRINZIDE,ZESTORETIC) 20-25 MG per tablet   Oral   Take 1 tablet by mouth daily.   30 tablet   3   . metFORMIN (GLUCOPHAGE) 500 MG tablet   Oral   Take 250 mg by mouth daily with breakfast. Pt states takes only 1/2 pill daily         . potassium chloride SA (K-DUR,KLOR-CON) 20 MEQ tablet   Oral   Take 20 mEq by mouth 2 (two) times daily.          BP 140/61  Pulse 87  Temp(Src) 98.8 F (37.1 C) (Oral)  Resp 17  Ht 5\' 3"  (1.6 m)  Wt 270 lb (122.471 kg)  BMI 47.84 kg/m2  SpO2 98%  LMP 08/07/2012 Physical Exam  Nursing note and vitals reviewed. Constitutional: She appears well-developed and well-nourished.  HENT:  Head: Normocephalic and atraumatic.  Mouth/Throat: Oropharynx is clear and moist.  Neck: Normal range of motion. Neck supple.  Cardiovascular:  Normal rate, regular rhythm and normal heart sounds.   Pulses:      Dorsalis pedis pulses are 2+ on the right side, and 2+ on the left side.  Pulmonary/Chest: Effort normal and breath sounds normal.  Abdominal: There is no tenderness.  Musculoskeletal: Normal range of motion.  Neurological: She is alert. She has normal strength. No cranial nerve deficit or sensory deficit. Gait normal.  Reflex Scores:      Patellar reflexes are 2+ on the right side and 2+ on the left side.      Achilles reflexes are 2+ on the right side and 2+ on the left side. Distal sensation of both feet intact  Skin: Skin is warm and dry.  Psychiatric: She has a normal mood and affect.    ED Course  Procedures (including critical care time) Labs Review Labs Reviewed - No data to display Imaging Review No results found.  EKG Interpretation   None       MDM  No diagnosis found. Patient with chronic back pain.  No  neurological deficits and normal neuro exam.  Patient can walk but states is painful.  No loss of bowel or bladder control.  No concern for cauda equina.  No fever, night sweats, weight loss, h/o cancer, IVDU.  RICE protocol and pain medicine indicated and discussed with patient.   Patient stable for discharge.  Return precautions given.     Santiago Glad, PA-C 08/16/13 1934

## 2013-08-16 NOTE — ED Notes (Addendum)
Pt reporting right lower back pain radiating down right leg, knee. Pain has been chronic for 1 year after a fall. Pt is able to bear weight. Reporting pain has been worse over past 3 weeks. Denies any urinary s/s.

## 2013-08-17 NOTE — ED Provider Notes (Signed)
Medical screening examination/treatment/procedure(s) were performed by non-physician practitioner and as supervising physician I was immediately available for consultation/collaboration.  EKG Interpretation   None        Doug Sou, MD 08/17/13 [phone]

## 2014-08-14 ENCOUNTER — Other Ambulatory Visit (HOSPITAL_COMMUNITY): Payer: Self-pay | Admitting: Primary Care

## 2014-08-14 ENCOUNTER — Ambulatory Visit (HOSPITAL_COMMUNITY)
Admission: RE | Admit: 2014-08-14 | Discharge: 2014-08-14 | Disposition: A | Discharge status: Home / Self Care | Payer: Self-pay | Source: Ambulatory Visit | Attending: Primary Care | Admitting: Primary Care

## 2014-08-14 DIAGNOSIS — M25532 Pain in left wrist: Secondary | ICD-10-CM | POA: Insufficient documentation

## 2014-08-14 DIAGNOSIS — R609 Edema, unspecified: Secondary | ICD-10-CM

## 2014-08-14 DIAGNOSIS — M7989 Other specified soft tissue disorders: Secondary | ICD-10-CM | POA: Insufficient documentation

## 2015-02-14 ENCOUNTER — Encounter (HOSPITAL_COMMUNITY): Payer: Self-pay

## 2015-02-14 ENCOUNTER — Emergency Department (HOSPITAL_COMMUNITY)
Admission: EM | Admit: 2015-02-14 | Discharge: 2015-02-14 | Disposition: A | Discharge status: Home / Self Care | Payer: Self-pay | Attending: Emergency Medicine | Admitting: Emergency Medicine

## 2015-02-14 DIAGNOSIS — G8929 Other chronic pain: Secondary | ICD-10-CM | POA: Insufficient documentation

## 2015-02-14 DIAGNOSIS — E669 Obesity, unspecified: Secondary | ICD-10-CM | POA: Insufficient documentation

## 2015-02-14 DIAGNOSIS — M5416 Radiculopathy, lumbar region: Secondary | ICD-10-CM | POA: Insufficient documentation

## 2015-02-14 DIAGNOSIS — I10 Essential (primary) hypertension: Secondary | ICD-10-CM | POA: Insufficient documentation

## 2015-02-14 DIAGNOSIS — E119 Type 2 diabetes mellitus without complications: Secondary | ICD-10-CM | POA: Insufficient documentation

## 2015-02-14 DIAGNOSIS — Z79899 Other long term (current) drug therapy: Secondary | ICD-10-CM | POA: Insufficient documentation

## 2015-02-14 LAB — CBG MONITORING, ED: Glucose-Capillary: 120 mg/dL — ABNORMAL HIGH (ref 65–99)

## 2015-02-14 MED ORDER — ONDANSETRON HCL 4 MG/2ML IJ SOLN
4.0000 mg | Freq: Once | INTRAMUSCULAR | Status: DC
  Filled 2015-02-14: qty 2

## 2015-02-14 MED ORDER — HYDROMORPHONE HCL 1 MG/ML IJ SOLN
1.0000 mg | Freq: Once | INTRAMUSCULAR | Status: AC
  Administered 2015-02-14: 1 mg via INTRAMUSCULAR
  Filled 2015-02-14: qty 1

## 2015-02-14 MED ORDER — PREDNISONE 20 MG PO TABS
60.0000 mg | ORAL_TABLET | Freq: Once | ORAL | Status: AC
  Administered 2015-02-14: 60 mg via ORAL
  Filled 2015-02-14: qty 3

## 2015-02-14 MED ORDER — HYDROCODONE-ACETAMINOPHEN 5-325 MG PO TABS: 1.0000 | ORAL_TABLET | Freq: Four times a day (QID) | ORAL | Status: DC | PRN

## 2015-02-14 MED ORDER — ONDANSETRON 4 MG PO TBDP
8.0000 mg | ORAL_TABLET | Freq: Once | ORAL | Status: AC
  Administered 2015-02-14: 8 mg via ORAL
  Filled 2015-02-14: qty 2

## 2015-02-14 MED ORDER — PROMETHAZINE HCL 25 MG/ML IJ SOLN
25.0000 mg | Freq: Once | INTRAMUSCULAR | Status: AC
  Administered 2015-02-14: 25 mg via INTRAMUSCULAR
  Filled 2015-02-14: qty 1

## 2015-02-14 MED ORDER — CYCLOBENZAPRINE HCL 10 MG PO TABS
10.0000 mg | ORAL_TABLET | Freq: Once | ORAL | Status: AC
  Administered 2015-02-14: 10 mg via ORAL
  Filled 2015-02-14: qty 1

## 2015-02-14 MED ORDER — CYCLOBENZAPRINE HCL 10 MG PO TABS: 10.0000 mg | ORAL_TABLET | Freq: Two times a day (BID) | ORAL | Status: DC | PRN

## 2015-02-14 MED ORDER — ONDANSETRON 4 MG PO TBDP
4.0000 mg | ORAL_TABLET | Freq: Once | ORAL | Status: AC
  Administered 2015-02-14: 4 mg via ORAL
  Filled 2015-02-14: qty 1

## 2015-02-14 NOTE — ED Notes (Signed)
One episode of emesis following medications, see MAR.  PA Kirichenko made aware with verbal face to face.

## 2015-02-14 NOTE — ED Notes (Signed)
Notified RN of CBG 120

## 2015-02-14 NOTE — Discharge Instructions (Signed)
Continue meloxicam for pain. norco for severe pain. Please follow up with your doctor for further evaluation.    Lumbosacral Radiculopathy Lumbosacral radiculopathy is a pinched nerve or nerves in the low back (lumbosacral area). When this happens you may have weakness in your legs and may not be able to stand on your toes. You may have pain going down into your legs. There may be difficulties with walking normally. There are many causes of this problem. Sometimes this may happen from an injury, or simply from arthritis or boney problems. It may also be caused by other illnesses such as diabetes. If there is no improvement after treatment, further studies may be done to find the exact cause. DIAGNOSIS  X-rays may be needed if the problems become long standing. Electromyograms may be done. This study is one in which the working of nerves and muscles is studied. HOME CARE INSTRUCTIONS   Applications of ice packs may be helpful. Ice can be used in a plastic bag with a towel around it to prevent frostbite to skin. This may be used every 2 hours for 20 to 30 minutes, or as needed, while awake, or as directed by your caregiver.  Only take over-the-counter or prescription medicines for pain, discomfort, or fever as directed by your caregiver.  If physical therapy was prescribed, follow your caregiver's directions. SEEK IMMEDIATE MEDICAL CARE IF:   You have pain not controlled with medications.  You seem to be getting worse rather than better.  You develop increasing weakness in your legs.  You develop loss of bowel or bladder control.  You have difficulty with walking or balance, or develop clumsiness in the use of your legs.  You have a fever. MAKE SURE YOU:   Understand these instructions.  Will watch your condition.  Will get help right away if you are not doing well or get worse. Document Released: 08/28/2005 Document Revised: 11/20/2011 Document Reviewed: 04/17/2008 [facility] Patient  Information 2015 [place], [place]. This information is not intended to replace advice given to you by your health care provider. Make sure you discuss any questions you have with your health care provider.

## 2015-02-14 NOTE — ED Provider Notes (Signed)
CSN: [id number]     Arrival date & time 02/14/15  0719 History   First MD Initiated Contact with Patient 02/14/15 [phone]     Chief Complaint  Patient presents with  . Back Pain     (Consider location/radiation/quality/duration/timing/severity/associated sxs/prior Treatment) HPI Tracy Byrd is a 59 y.o. female with history of hypertension, diabetes, chronic back pain, presents to emergency department complaining of back pain. Patient states her symptoms started 3 days ago after she walked around the grocery store. Patient states pain is in the low back, radiates down bilateral legs. States pain is mainly worse in the right leg. States pain shoots from the lower back and to the right buttock, into the back of the right leg. She has been on meloxicam for her pain as prescribed by her doctor. Denies urinary or bowel incontinence. No weakness or numbness in legs. No fever or chills. No abdominal pain.   Past Medical History  Diagnosis Date  . Hypertension   . Back pain, chronic   . Degenerative disc disease   . Diabetes mellitus without complication   . Obesity    Past Surgical History  Procedure Laterality Date  . Cholecystectomy    . Hernia repair    . Cesarean section    . Tonsillectomy     History reviewed. No pertinent family history. History  Substance Use Topics  . Smoking status: Never Smoker   . Smokeless tobacco: Not on file  . Alcohol Use: No   OB History    No data available     Review of Systems  Constitutional: Negative for fever and chills.  Respiratory: Negative for cough, chest tightness and shortness of breath.   Cardiovascular: Negative for chest pain, palpitations and leg swelling.  Gastrointestinal: Negative for nausea, vomiting, abdominal pain and diarrhea.  Genitourinary: Negative for dysuria and flank pain.  Musculoskeletal: Positive for back pain and arthralgias. Negative for myalgias, neck pain and neck stiffness.  Skin: Negative for rash.   Neurological: Negative for dizziness, weakness, numbness and headaches.  All other systems reviewed and are negative.     Allergies  Tramadol  Home Medications   Prior to Admission medications   Medication Sig Start Date End Date Taking? Authorizing Provider  cyclobenzaprine (FLEXERIL) 10 MG tablet Take 1 tablet (10 mg total) by mouth at bedtime as needed. For pain 08/19/12   Clanford Marisa Hua, MD  HYDROcodone-acetaminophen (NORCO/VICODIN) 5-325 MG per tablet Take 1-2 tablets by mouth every 6 (six) hours as needed. 08/16/13   Heather Laisure, PA-C  ibuprofen (ADVIL,MOTRIN) 800 MG tablet Take 800 mg by mouth every 8 (eight) hours as needed for pain.    Historical Provider, MD  lisinopril-hydrochlorothiazide (PRINZIDE,ZESTORETIC) 20-25 MG per tablet Take 1 tablet by mouth daily. 08/19/12   Clanford Marisa Hua, MD  metFORMIN (GLUCOPHAGE) 500 MG tablet Take 250 mg by mouth daily with breakfast. Pt states takes only 1/2 pill daily 08/19/12   Clanford Marisa Hua, MD  potassium chloride SA (K-DUR,KLOR-CON) 20 MEQ tablet Take 20 mEq by mouth 2 (two) times daily.    Historical Provider, MD   BP 140/60 mmHg  Pulse 84  Temp(Src) 98.5 F (36.9 C) (Oral)  Resp 18  Ht 5\' 3"  (1.6 m)  Wt 280 lb (127.007 kg)  BMI 49.61 kg/m2  SpO2 95%  LMP 08/07/2012 Physical Exam  Constitutional: She appears well-developed and well-nourished. No distress.  HENT:  Head: Normocephalic.  Eyes: Conjunctivae are normal.  Neck: Neck supple.  Cardiovascular: Normal  rate, regular rhythm and normal heart sounds.   Pulmonary/Chest: Effort normal and breath sounds normal. No respiratory distress. She has no wheezes. She has no rales.  Abdominal: Soft. Bowel sounds are normal. She exhibits no distension. There is no tenderness. There is no rebound.  Musculoskeletal: She exhibits no edema.  Tender to palpation in the midline lumbar spine, tenderness extends into the right SI joint, right buttock, right paravertebral spinal  muscles. No pain with right straight leg raise. Full range of motion of the right hip and right knee. Pedal pulses intact and equal bilaterally.  Neurological: She is alert.  Skin: Skin is warm and dry.  Psychiatric: She has a normal mood and affect. Her behavior is normal.  Nursing note and vitals reviewed.   ED Course  Procedures (including critical care time) Labs Review Labs Reviewed  CBG MONITORING, ED - Abnormal; Notable for the following:    Glucose-Capillary 120 (*)    All other components within normal limits    Imaging Review No results found.   EKG Interpretation None      MDM   Final diagnoses:  Lumbar back pain with radiculopathy affecting right lower extremity   patient with acute exacerbation of chronic back pain. No evidence of cauda equina at this time. Will try to get pain under control, she will need to follow-up with her primary care doctor..  10:10 AM Pt became nauseated and had few episodes of emesis after receiving pain medication. Will try zofran  No improvement with Zofran for nausea. Phenergan given for nausea IM.  Patient feels better after Phenergan. Back pain improved as well. We'll discharge home with NSAIDs, Norco for severe pain, follow with primary care doctor. No evidence of cauda equina. She is afebrile. No further imaging indicated on emergent basis  Filed Vitals:   02/14/15 1030 02/14/15 1130 02/14/15 1131 02/14/15 1200  BP: 148/72 154/63 154/63 156/61  Pulse: 78 81 84 85  Temp:      TempSrc:      Resp:  16 18   Height:      Weight:      SpO2: 90% 94% 94% 94%     Jeannett Senior, PA-C 02/14/15 1611  Lacretia Leigh, MD 02/16/15 1142

## 2015-02-14 NOTE — ED Notes (Signed)
Pt reports left middle back pain that radiates down to lower back and bilateral legs.  Pt denies urinary symptoms and reports that the pain has been present x3 days.  Pt reports she has a degenerative disc disease.

## 2017-02-27 ENCOUNTER — Other Ambulatory Visit (HOSPITAL_COMMUNITY): Payer: Self-pay | Admitting: Orthopedic Surgery

## 2017-02-27 DIAGNOSIS — M7989 Other specified soft tissue disorders: Principal | ICD-10-CM

## 2017-02-27 DIAGNOSIS — M79604 Pain in right leg: Secondary | ICD-10-CM

## 2017-02-28 ENCOUNTER — Ambulatory Visit (HOSPITAL_COMMUNITY)
Admission: RE | Admit: 2017-02-28 | Discharge: 2017-02-28 | Disposition: A | Discharge status: Home / Self Care | Payer: Medicare HMO | Source: Ambulatory Visit | Attending: Orthopedic Surgery | Admitting: Orthopedic Surgery

## 2017-02-28 DIAGNOSIS — M79604 Pain in right leg: Secondary | ICD-10-CM

## 2017-02-28 DIAGNOSIS — M7989 Other specified soft tissue disorders: Secondary | ICD-10-CM | POA: Insufficient documentation

## 2017-02-28 NOTE — Progress Notes (Signed)
**  Preliminary report by tech**  Right lower extremity venous duplex complete. There is no obvious evidence of deep or superficial vein thrombosis involving the right lower extremity. All clearly visualized vessels appears patent and compressible. There is no evidence of a Baker's cyst on the right. Results were given to [facility] at Dr. Charlestine Night office.  02/28/17 11:40 AM Tracy Byrd RVT

## 2018-06-05 ENCOUNTER — Other Ambulatory Visit: Payer: Self-pay | Admitting: Obstetrics and Gynecology

## 2018-06-05 ENCOUNTER — Other Ambulatory Visit (HOSPITAL_COMMUNITY)
Admission: RE | Admit: 2018-06-05 | Discharge: 2018-06-05 | Disposition: A | Discharge status: Home / Self Care | Payer: Medicare HMO | Source: Ambulatory Visit | Attending: Obstetrics and Gynecology | Admitting: Obstetrics and Gynecology

## 2018-06-05 DIAGNOSIS — Z01411 Encounter for gynecological examination (general) (routine) with abnormal findings: Secondary | ICD-10-CM | POA: Insufficient documentation

## 2018-06-06 ENCOUNTER — Other Ambulatory Visit: Payer: Self-pay | Admitting: Internal Medicine

## 2018-06-06 DIAGNOSIS — Z1231 Encounter for screening mammogram for malignant neoplasm of breast: Secondary | ICD-10-CM

## 2018-06-10 LAB — CYTOLOGY - PAP

## 2018-07-11 ENCOUNTER — Ambulatory Visit
Admission: RE | Admit: 2018-07-11 | Discharge: 2018-07-11 | Disposition: A | Discharge status: Home / Self Care | Payer: Medicare HMO | Source: Ambulatory Visit | Attending: Internal Medicine | Admitting: Internal Medicine

## 2018-07-11 DIAGNOSIS — Z1231 Encounter for screening mammogram for malignant neoplasm of breast: Secondary | ICD-10-CM

## 2018-07-12 ENCOUNTER — Other Ambulatory Visit: Payer: Self-pay | Admitting: Internal Medicine

## 2018-07-12 DIAGNOSIS — R928 Other abnormal and inconclusive findings on diagnostic imaging of breast: Secondary | ICD-10-CM

## 2018-07-17 ENCOUNTER — Ambulatory Visit
Admission: RE | Admit: 2018-07-17 | Discharge: 2018-07-17 | Disposition: A | Discharge status: Home / Self Care | Payer: Medicare HMO | Source: Ambulatory Visit | Attending: Internal Medicine | Admitting: Internal Medicine

## 2018-07-17 DIAGNOSIS — R928 Other abnormal and inconclusive findings on diagnostic imaging of breast: Secondary | ICD-10-CM

## 2018-07-29 NOTE — Pre-Procedure Instructions (Signed)
Tracy Byrd  07/29/2018      [facility] [place], [place] - [address] [place] [address] Phone: [phone] Fax: [phone]  [id number] - [place], [place] [address] AT [address] [place] [address] Phone: [phone] Fax: [phone]    Your procedure is scheduled on Wednesday November 27.  Report to The Main Entrance at [facility] at 13:15 P.M.  Pick up the phone at the desk and dial [phone]  Call this number if you have problems the morning of surgery: [phone]    Remember:  Do not eat or drink after midnight.    Take these medicines the morning of surgery with A SIP OF WATER:   Allopurinol (Zyloprim) Omeprazole (prilosec)  DO NOT TAKE Semaglutide (Ozempic) the day of surgery  DO NOT TAKE NPH-Regular 70/30 insulin the day of surgery  TAKE 70% of NPH-Regular 70-30 insulin the day before surgery (10 units)   7 days prior to surgery STOP taking any Meloxicam (Mobic), Aspirin(unless otherwise instructed by your surgeon), Aleve, Naproxen, Ibuprofen, Motrin, Advil, Goody's, BC's, all herbal medications, fish oil, and all vitamins  Brush your teeth the morning of surgery  Do not smoke the morning of surgery.      How to Manage Your Diabetes Before and After Surgery  Why is it important to control my blood sugar before and after surgery? . Improving blood sugar levels before and after surgery helps healing and can limit problems. . A way of improving blood sugar control is eating a healthy diet by: o  Eating less sugar and carbohydrates o  Increasing activity/exercise o  Talking with your doctor about reaching your blood sugar goals . High blood sugars (greater than 180 mg/dL) can raise your risk of infections and slow your recovery, so you will need to focus on controlling your diabetes during the weeks before  surgery. . Make sure that the doctor who takes care of your diabetes knows about your planned surgery including the date and location.  How do I manage my blood sugar before surgery? . Check your blood sugar at least 4 times a day, starting 2 days before surgery, to make sure that the level is not too high or low. o Check your blood sugar the morning of your surgery when you wake up and every 2 hours until you get to the Short Stay unit. . If your blood sugar is less than 70 mg/dL, you will need to treat for low blood sugar: o Do not take insulin. o Treat a low blood sugar (less than 70 mg/dL) with  cup of clear juice (cranberry or apple), 4 glucose tablets, OR glucose gel. Recheck blood sugar in 15 minutes after treatment (to make sure it is greater than 70 mg/dL). If your blood sugar is not greater than 70 mg/dL on recheck, call [phone] o  for further instructions. . Report your blood sugar to the short stay nurse when you get to Short Stay.  . If you are admitted to the [facility] after surgery: o Your blood sugar will be checked by the staff and you will probably be given insulin after surgery (instead of oral diabetes medicines) to make sure you have good blood sugar levels. o The goal for blood sugar control after surgery is 80-180 mg/dL.            Do not wear jewelry,  body piercings, make-up or nail polish.  Do not wear lotions, powders, or perfumes, or deodorant.  Do not shave 48 hours prior to surgery.  Men may shave face and neck.  Do not bring valuables to the [facility].  [facility] is not responsible for any belongings or valuables.  Contacts, dentures or bridgework may not be worn into surgery.  Leave your suitcase in the car.  After surgery it may be brought to your room.  For patients admitted to the [facility], check out time is 11AM the day of discharge You must have a responsible adult with you for 24 hours after surgery.   Patients discharged the day of  surgery will not be allowed to drive home.    Special instructions:    [facility]- Preparing For Surgery  Before surgery, you can play an important role. Because skin is not sterile, your skin needs to be as free of germs as possible. You can reduce the number of germs on your skin by washing with CHG (chlorahexidine gluconate) Soap before surgery.  CHG is an antiseptic cleaner which kills germs and bonds with the skin to continue killing germs even after washing.    Oral Hygiene is also important to reduce your risk of infection.  Remember - BRUSH YOUR TEETH THE MORNING OF SURGERY WITH YOUR REGULAR TOOTHPASTE  Please do not use if you have an allergy to CHG or antibacterial soaps. If your skin becomes reddened/irritated stop using the CHG.  Do not shave (including legs and underarms) for at least 48 hours prior to first CHG shower. It is OK to shave your face.  Please follow these instructions carefully.   1. Shower the NIGHT BEFORE SURGERY and the MORNING OF SURGERY with CHG.   2. If you chose to wash your hair, wash your hair first as usual with your normal shampoo.  3. After you shampoo, rinse your hair and body thoroughly to remove the shampoo.  4. Use CHG as you would any other liquid soap. You can apply CHG directly to the skin and wash gently with a scrungie or a clean washcloth.   5. Apply the CHG Soap to your body ONLY FROM THE NECK DOWN.  Do not use on open wounds or open sores. Avoid contact with your eyes, ears, mouth and genitals (private parts). Wash Face and genitals (private parts)  with your normal soap.  6. Wash thoroughly, paying special attention to the area where your surgery will be performed.  7. Thoroughly rinse your body with warm water from the neck down.  8. DO NOT shower/wash with your normal soap after using and rinsing off the CHG Soap.  9. Pat yourself dry with a CLEAN TOWEL.  10. Wear CLEAN PAJAMAS to bed the night before surgery, wear comfortable  clothes the morning of surgery  11. Place CLEAN SHEETS on your bed the night of your first shower and DO NOT SLEEP WITH PETS.    Day of Surgery:  Do not apply any deodorants/lotions.  Please wear clean clothes to the [facility]/surgery center.   Remember to brush your teeth WITH YOUR REGULAR TOOTHPASTE.    Please read over the following fact sheets that you were given. Coughing and Deep Breathing and Surgical Site Infection Prevention

## 2018-07-30 ENCOUNTER — Encounter (HOSPITAL_COMMUNITY)
Admission: RE | Admit: 2018-07-30 | Discharge: 2018-07-30 | Disposition: A | Discharge status: Home / Self Care | Payer: Medicare HMO | Source: Ambulatory Visit | Attending: Obstetrics and Gynecology | Admitting: Obstetrics and Gynecology

## 2018-07-30 ENCOUNTER — Encounter (HOSPITAL_COMMUNITY): Payer: Self-pay

## 2018-07-30 ENCOUNTER — Other Ambulatory Visit: Payer: Self-pay

## 2018-07-30 DIAGNOSIS — Z01812 Encounter for preprocedural laboratory examination: Secondary | ICD-10-CM | POA: Diagnosis present

## 2018-07-30 DIAGNOSIS — E119 Type 2 diabetes mellitus without complications: Secondary | ICD-10-CM | POA: Insufficient documentation

## 2018-07-30 LAB — CBC
HCT: 38.5 % (ref 36.0–46.0)
HEMOGLOBIN: 12.5 g/dL (ref 12.0–15.0)
MCH: 28 pg (ref 26.0–34.0)
MCHC: 32.5 g/dL (ref 30.0–36.0)
MCV: 86.1 fL (ref 80.0–100.0)
NRBC: 0 % (ref 0.0–0.2)
PLATELETS: 335 10*3/uL (ref 150–400)
RBC: 4.47 MIL/uL (ref 3.87–5.11)
RDW: 15.2 % (ref 11.5–15.5)
WBC: 11.4 10*3/uL — ABNORMAL HIGH (ref 4.0–10.5)

## 2018-07-30 LAB — BASIC METABOLIC PANEL
ANION GAP: 9 (ref 5–15)
BUN: 10 mg/dL (ref 8–23)
CALCIUM: 9.3 mg/dL (ref 8.9–10.3)
CO2: 23 mmol/L (ref 22–32)
Chloride: 104 mmol/L (ref 98–111)
Creatinine, Ser: 0.96 mg/dL (ref 0.44–1.00)
GFR calc Af Amer: >60 mL/min (ref >60)
GFR calc non Af Amer: >60 mL/min (ref >60)
Glucose, Bld: 171 mg/dL — ABNORMAL HIGH (ref 70–99)
POTASSIUM: 3.5 mmol/L (ref 3.5–5.1)
Sodium: 136 mmol/L (ref 135–145)

## 2018-07-30 LAB — HEMOGLOBIN A1C
Hgb A1c MFr Bld: 6.8 % — ABNORMAL HIGH (ref 4.8–5.6)
Mean Plasma Glucose: 148.46 mg/dL

## 2018-07-30 LAB — GLUCOSE, CAPILLARY: Glucose-Capillary: 154 mg/dL — ABNORMAL HIGH (ref 70–99)

## 2018-07-30 NOTE — Progress Notes (Signed)
PCP - Sandi Mariscal Cardiologist - denies  EKG - within last year (July 2019)- requested from Dr. Nancy Fetter  Fasting Blood Sugar - 109-121 per patient, A1c checked today  Anesthesia review: follow up EKG records  Patient denies shortness of breath, fever, cough and chest pain at PAT appointment   Patient verbalized understanding of instructions that were given to them at the PAT appointment. Patient was also instructed that they will need to review over the PAT instructions again at home before surgery.

## 2018-07-31 NOTE — Anesthesia Preprocedure Evaluation (Addendum)
Anesthesia Evaluation  Patient identified by MRN, date of birth, ID band Patient awake    Reviewed: Allergy & Precautions, NPO status , Patient's Chart, lab work & pertinent test results  Airway Mallampati: II       Dental  (+) Lower Dentures, Upper Dentures   Pulmonary neg pulmonary ROS,    Pulmonary exam normal        Cardiovascular hypertension, Pt. on medications Normal cardiovascular exam Rhythm:Regular Rate:Normal     Neuro/Psych negative neurological ROS  negative psych ROS   GI/Hepatic Neg liver ROS, GERD  Medicated and Controlled,  Endo/Other  diabetes, Insulin DependentMorbid obesity  Renal/GU negative Renal ROS  negative genitourinary   Musculoskeletal  (+) Arthritis ,   Abdominal (+) + obese,   Peds  Hematology negative hematology ROS (+)   Anesthesia Other Findings   Reproductive/Obstetrics                            Anesthesia Physical Anesthesia Plan  ASA: III  Anesthesia Plan: General   Post-op Pain Management:    Induction: Intravenous  PONV Risk Score and Plan: 4 or greater and Ondansetron and Treatment may vary due to age or medical condition  Airway Management Planned: LMA  Additional Equipment:   Intra-op Plan:   Post-operative Plan: Extubation in OR  Informed Consent: I have reviewed the patients History and Physical, chart, labs and discussed the procedure including the risks, benefits and alternatives for the proposed anesthesia with the patient or authorized representative who has indicated his/her understanding and acceptance.   Dental advisory given  Plan Discussed with: CRNA and Surgeon  Anesthesia Plan Comments: (EKG received from pt's PCP dated 05/06/18 shows Sinus rhythm. Rate 86. Low voltage precordial leads. Poor R-wave progression.)      Anesthesia Quick Evaluation

## 2018-08-05 NOTE — H&P (Signed)
   History of Present Illness  General:  Pt is still bleeding. Stopped with Provera previously but started again when she finished. 62 y/o female presents today for pre operative clearance. Pap 05/2018 negative, HPV negative Endometrium Biopsy performed on 05/2018, Normal.   Current Medications  Not-Taking   Allopurinol 300 MG Tablet as directed Orally   Atorvastatin Calcium 10 MG Tablet 1 tablet Orally Once a day   Lisinopril 2.5 MG Tablet 1 tablet Orally Once a day   Meloxicam 15 MG Tablet 1 tablet Orally Once a day   Metformin HCl 1000 MG Tablet 1 tablet with a meal Orally Twice a day   Novolin 70/30(Insulin NPH Isophane & Regular) (70-30) 100 UNIT/ML Suspension as directed Subcutaneous   Omeprazole 20 MG Capsule Delayed Release 1 capsule Orally Once a day   Provera(medroxyPROGESTERone) 10 MG Tablet 1 tablet with food Orally Once a day   Medication List reviewed and reconciled with the patient    Past Medical History  diabetes mellitus-[facility] [place].   Hypertension.   Degenerative arthritis.    Surgical History  Tonsillectomy 1978  Gall Stones 1982  C section 2002  hernia 2009   Family History  No Family History documented.   Social History  General:  no Alcohol.  Children: 1, daughter (s).  Tobacco use  cigarettes: Never smoked Tobacco history last updated 07/24/2018 Vaping No Marital Status: single.    Gyn History  Sexual activity not currently sexually active.  LMP Pt started 05/20/2018 still going. Been bleeding on and off.  Last pap smear date 06/05/2018 Neg/HPVneg.  Last mammogram date 2 yrs ago.    OB History  Number of pregnancies 1.  Pregnancy # 1 live birth, C-section delivery, girl (pt was 36 when pregnant).    Allergies  N.K.D.A.   Hospitalization/Major Diagnostic Procedure  None this past yr 07/2018   Review of Systems  See scanned ROS form for details Denies fever/chills, chest pain, SOB, headaches,  numbness/tingling. No h/o complication with anesthesia, bleeding disorders or blood clots.   Vital Signs  Wt 257.7, Wt change -9.6 lb, Ht 63, BMI 45.64, Temp 98.5, Pulse sitting 91, BP sitting 140/80.   Physical Examination  GENERAL:  Patient appears alert and oriented.  General Appearance: well-appearing, well-developed, no acute distress.  Speech: clear.  LUNGS:  Auscultation: no wheezing/rhonchi/rales. CTA bilaterally.  HEART:  Heart sounds: normal. RRR. no murmur.  ABDOMEN:  General: soft nontender, nondistended, no masses.  FEMALE GENITOURINARY:  Pelvic Cervix normal, Uterus normal, tender to palpation, heavy bleeding but not active.  EXTREMITIES:  General: No edema or calf tenderness.     Assessments   1. Pre-operative clearance - Z01.818 (Primary)   2. PMB (postmenopausal bleeding) - N95.0   Treatment  1. Pre-operative clearance  Notes: Discussed R/B/A of hysteroscopy, D&C, removal of endometrial mass.  Referral To: Reason:Need to move up surgery.    2. PMB (postmenopausal bleeding)  Refill Provera Tablet, 10 MG, 1 tablet with food, Orally, Once a day, 14 days, 14 Tablet, Refills 1 Notes: Will attempt to move up surgery due to significant bleeding to r/o cancer.    Visit Codes  99213 OV LEVEL 3.    Follow Up  2 Weeks post op

## 2018-08-07 ENCOUNTER — Encounter (HOSPITAL_COMMUNITY)
Admission: RE | Disposition: A | Discharge status: Home / Self Care | Payer: Self-pay | Source: Ambulatory Visit | Attending: Obstetrics and Gynecology

## 2018-08-07 ENCOUNTER — Ambulatory Visit (HOSPITAL_COMMUNITY): Payer: Medicare HMO | Admitting: Physician Assistant

## 2018-08-07 ENCOUNTER — Encounter (HOSPITAL_COMMUNITY): Payer: Self-pay | Admitting: Emergency Medicine

## 2018-08-07 ENCOUNTER — Ambulatory Visit (HOSPITAL_COMMUNITY)
Admission: RE | Admit: 2018-08-07 | Discharge: 2018-08-07 | Disposition: A | Discharge status: Home / Self Care | Payer: Medicare HMO | Source: Ambulatory Visit | Attending: Obstetrics and Gynecology | Admitting: Obstetrics and Gynecology

## 2018-08-07 ENCOUNTER — Ambulatory Visit (HOSPITAL_COMMUNITY): Payer: Medicare HMO | Admitting: Anesthesiology

## 2018-08-07 DIAGNOSIS — N840 Polyp of corpus uteri: Secondary | ICD-10-CM | POA: Diagnosis not present

## 2018-08-07 DIAGNOSIS — Z794 Long term (current) use of insulin: Secondary | ICD-10-CM | POA: Insufficient documentation

## 2018-08-07 DIAGNOSIS — K219 Gastro-esophageal reflux disease without esophagitis: Secondary | ICD-10-CM | POA: Diagnosis not present

## 2018-08-07 DIAGNOSIS — Z79899 Other long term (current) drug therapy: Secondary | ICD-10-CM | POA: Insufficient documentation

## 2018-08-07 DIAGNOSIS — I10 Essential (primary) hypertension: Secondary | ICD-10-CM | POA: Diagnosis not present

## 2018-08-07 DIAGNOSIS — E119 Type 2 diabetes mellitus without complications: Secondary | ICD-10-CM | POA: Diagnosis not present

## 2018-08-07 DIAGNOSIS — N950 Postmenopausal bleeding: Secondary | ICD-10-CM | POA: Insufficient documentation

## 2018-08-07 DIAGNOSIS — D250 Submucous leiomyoma of uterus: Secondary | ICD-10-CM | POA: Insufficient documentation

## 2018-08-07 DIAGNOSIS — D259 Leiomyoma of uterus, unspecified: Secondary | ICD-10-CM | POA: Diagnosis present

## 2018-08-07 HISTORY — PX: DILATATION & CURETTAGE/HYSTEROSCOPY WITH MYOSURE: SHX6511

## 2018-08-07 LAB — GLUCOSE, CAPILLARY
Glucose-Capillary: 112 mg/dL — ABNORMAL HIGH (ref 70–99)
Glucose-Capillary: 74 mg/dL (ref 70–99)

## 2018-08-07 SURGERY — DILATATION & CURETTAGE/HYSTEROSCOPY WITH MYOSURE
Anesthesia: General

## 2018-08-07 MED ORDER — ACETAMINOPHEN 325 MG PO TABS: 325.0000 mg | ORAL_TABLET | ORAL | Status: DC | PRN

## 2018-08-07 MED ORDER — SCOPOLAMINE 1 MG/3DAYS TD PT72: 1.0000 | MEDICATED_PATCH | TRANSDERMAL | Status: DC

## 2018-08-07 MED ORDER — ONDANSETRON HCL 4 MG/2ML IJ SOLN
4.0000 mg | Freq: Once | INTRAMUSCULAR | Status: AC | PRN
  Administered 2018-08-07: 4 mg via INTRAVENOUS

## 2018-08-07 MED ORDER — MEPERIDINE HCL 25 MG/ML IJ SOLN: 6.2500 mg | INTRAMUSCULAR | Status: DC | PRN

## 2018-08-07 MED ORDER — MIDAZOLAM HCL 2 MG/2ML IJ SOLN
INTRAMUSCULAR | Status: AC
  Filled 2018-08-07: qty 2

## 2018-08-07 MED ORDER — LIDOCAINE HCL 2 % IJ SOLN
INTRAMUSCULAR | Status: DC | PRN
  Administered 2018-08-07: 10 mL

## 2018-08-07 MED ORDER — SODIUM CHLORIDE 0.9 % IR SOLN
Status: DC | PRN
  Administered 2018-08-07 (×2): 3000 mL

## 2018-08-07 MED ORDER — ACETAMINOPHEN 160 MG/5ML PO SOLN: 325.0000 mg | ORAL | Status: DC | PRN

## 2018-08-07 MED ORDER — LIDOCAINE HCL (CARDIAC) PF 100 MG/5ML IV SOSY
PREFILLED_SYRINGE | INTRAVENOUS | Status: DC | PRN
  Administered 2018-08-07: 100 mg via INTRAVENOUS

## 2018-08-07 MED ORDER — LIDOCAINE HCL 2 % IJ SOLN
INTRAMUSCULAR | Status: AC
  Filled 2018-08-07: qty 20

## 2018-08-07 MED ORDER — FENTANYL CITRATE (PF) 100 MCG/2ML IJ SOLN
INTRAMUSCULAR | Status: AC
  Filled 2018-08-07: qty 2

## 2018-08-07 MED ORDER — MIDAZOLAM HCL 2 MG/2ML IJ SOLN
INTRAMUSCULAR | Status: DC | PRN
  Administered 2018-08-07: 1 mg via INTRAVENOUS

## 2018-08-07 MED ORDER — FENTANYL CITRATE (PF) 100 MCG/2ML IJ SOLN
INTRAMUSCULAR | Status: DC | PRN
  Administered 2018-08-07: 50 ug via INTRAVENOUS

## 2018-08-07 MED ORDER — FENTANYL CITRATE (PF) 100 MCG/2ML IJ SOLN: 25.0000 ug | INTRAMUSCULAR | Status: DC | PRN

## 2018-08-07 MED ORDER — LACTATED RINGERS IV SOLN
INTRAVENOUS | Status: DC
  Administered 2018-08-07 (×2): via INTRAVENOUS

## 2018-08-07 MED ORDER — KETOROLAC TROMETHAMINE 30 MG/ML IJ SOLN: 30.0000 mg | Freq: Once | INTRAMUSCULAR | Status: DC | PRN

## 2018-08-07 MED ORDER — ONDANSETRON HCL 4 MG/2ML IJ SOLN
INTRAMUSCULAR | Status: AC
  Filled 2018-08-07: qty 2

## 2018-08-07 MED ORDER — VASOPRESSIN 20 UNIT/ML IV SOLN
INTRAVENOUS | Status: AC
  Filled 2018-08-07: qty 1

## 2018-08-07 MED ORDER — SODIUM CHLORIDE (PF) 0.9 % IJ SOLN
INTRAMUSCULAR | Status: AC
  Filled 2018-08-07: qty 50

## 2018-08-07 MED ORDER — PROPOFOL 10 MG/ML IV BOLUS
INTRAVENOUS | Status: DC | PRN
  Administered 2018-08-07: 150 mg via INTRAVENOUS

## 2018-08-07 MED ORDER — IBUPROFEN 600 MG PO TABS: 600.0000 mg | ORAL_TABLET | Freq: Four times a day (QID) | ORAL | 0 refills | Status: DC | PRN

## 2018-08-07 MED ORDER — ONDANSETRON HCL 4 MG/2ML IJ SOLN
INTRAMUSCULAR | Status: DC | PRN
  Administered 2018-08-07: 4 mg via INTRAVENOUS

## 2018-08-07 SURGICAL SUPPLY — 13 items
CATH ROBINSON RED A/P 16FR (CATHETERS) ×3 IMPLANT
DEVICE MYOSURE LITE (MISCELLANEOUS) IMPLANT
DEVICE MYOSURE REACH (MISCELLANEOUS) IMPLANT
DILATOR CANAL MILEX (MISCELLANEOUS) ×3 IMPLANT
GLOVE BIO SURGEON STRL SZ7 (GLOVE) ×3 IMPLANT
GLOVE BIOGEL PI IND STRL 7.0 (GLOVE) ×2 IMPLANT
GLOVE BIOGEL PI INDICATOR 7.0 (GLOVE) ×4
GOWN STRL REUS W/TWL LRG LVL3 (GOWN DISPOSABLE) ×6 IMPLANT
KIT PROCEDURE FLUENT (KITS) ×3 IMPLANT
PACK VAGINAL MINOR WOMEN LF (CUSTOM PROCEDURE TRAY) ×3 IMPLANT
PAD OB MATERNITY 4.3X12.25 (PERSONAL CARE ITEMS) ×3 IMPLANT
SEAL ROD LENS SCOPE MYOSURE (ABLATOR) ×3 IMPLANT
TOWEL OR 17X24 6PK STRL BLUE (TOWEL DISPOSABLE) ×6 IMPLANT

## 2018-08-07 NOTE — Interval H&P Note (Signed)
History and Physical Interval Note:  08/07/2018 2:20 PM  Tracy Byrd  has presented today for surgery, with the diagnosis of N95.0 Postmenopausal bleeding D21.9 Fibroids  The various methods of treatment have been discussed with the patient and family. After consideration of risks, benefits and other options for treatment, the patient has consented to  Procedure(s) with comments: [place] (N/A) - rep will be here confirmed on 07/29/18 as a surgical intervention .  The patient's history has been reviewed, patient examined, no change in status, stable for surgery.  I have reviewed the patient's chart and labs.  Questions were answered to the patient's satisfaction.     Thurnell Lose

## 2018-08-07 NOTE — Discharge Instructions (Signed)
DISCHARGE INSTRUCTIONS: D&C The following instructions have been prepared to help you care for yourself upon your return home.   Personal hygiene:  Use sanitary pads for vaginal drainage, not tampons.  Shower the day after your procedure.  NO tub baths, pools or Jacuzzis for 2-3 weeks.  Wipe front to back after using the bathroom.  Activity and limitations:  Do NOT drive or operate any equipment for 24 hours. The effects of anesthesia are still present and drowsiness may result.  Do NOT rest in bed all day.  Walking is encouraged.  Walk up and down stairs slowly.  You may resume your normal activity in one to two days or as indicated by your physician.  Sexual activity: NO intercourse for at least 2 weeks after the procedure, or as indicated by your physician.  Diet: Eat a light meal as desired this evening. You may resume your usual diet tomorrow.  Return to work: You may resume your work activities in one to two days or as indicated by your doctor.  What to expect after your surgery: Expect to have vaginal bleeding/discharge for 2-3 days and spotting for up to 10 days. It is not unusual to have soreness for up to 1-2 weeks. You may have a slight burning sensation when you urinate for the first day. Mild cramps may continue for a couple of days. You may have a regular period in 2-6 weeks.  Call your doctor for any of the following:  Excessive vaginal bleeding, saturating and changing one pad every hour.  Inability to urinate 6 hours after discharge from hospital.  Pain not relieved by pain medication.  Fever of 100.4 F or greater.  Unusual vaginal discharge or odor.   Post Anesthesia Home Care Instructions  Activity: Get plenty of rest for the remainder of the day. A responsible individual must stay with you for 24 hours following the procedure.  For the next 24 hours, DO NOT: -Drive a car -Operate machinery -Drink alcoholic beverages -Take any medication unless instructed  by your physician -Make any legal decisions or sign important papers.  Meals: Start with liquid foods such as gelatin or soup. Progress to regular foods as tolerated. Avoid greasy, spicy, heavy foods. If nausea and/or vomiting occur, drink only clear liquids until the nausea and/or vomiting subsides. Call your physician if vomiting continues.  Special Instructions/Symptoms: Your throat may feel dry or sore from the anesthesia or the breathing tube placed in your throat during surgery. If this causes discomfort, gargle with warm salt water. The discomfort should disappear within 24 hours.         

## 2018-08-07 NOTE — Anesthesia Procedure Notes (Signed)
Procedure Name: LMA Insertion Date/Time: 08/07/2018 2:51 PM Performed by: Hewitt Blade, CRNA Pre-anesthesia Checklist: Patient identified, Emergency Drugs available, Suction available and Patient being monitored Patient Re-evaluated:Patient Re-evaluated prior to induction Oxygen Delivery Method: Circle system utilized Preoxygenation: Pre-oxygenation with 100% oxygen Induction Type: IV induction LMA: LMA inserted LMA Size: 4.0 Number of attempts: 1 Placement Confirmation: positive ETCO2 and breath sounds checked- equal and bilateral Tube secured with: Tape Dental Injury: Teeth and Oropharynx as per pre-operative assessment

## 2018-08-07 NOTE — Brief Op Note (Signed)
08/07/2018  4:15 PM  PATIENT:  Tracy Byrd  62 y.o. female  PRE-OPERATIVE DIAGNOSIS:  N95.0 Postmenopausal bleeding,  D21.9 Fibroids  POST-OPERATIVE DIAGNOSIS:  N95.0 Postmenopausal bleeding D21.9 Fibroids  PROCEDURE:  Procedure(s) with comments: DILATATION & CURETTAGE/HYSTEROSCOPY WITH MYOSURE (N/A) - rep will be here confirmed on 07/29/18  SURGEON:  Surgeon(s) and Role:    Thurnell Lose, MD - Primary  PHYSICIAN ASSISTANT:   ASSISTANTS: none   ANESTHESIA:   general and paracervical block  EBL:  20 mL   Deficit 750 ml  BLOOD ADMINISTERED:none  DRAINS: none   LOCAL MEDICATIONS USED:  2% XYLOCAINE  and Amount: 10 ml  SPECIMEN:  Source of Specimen:  polyp, fibroid, endometrial currettings  DISPOSITION OF SPECIMEN:  PATHOLOGY  COUNTS:  YES  TOURNIQUET:  * No tourniquets in log *  DICTATION: .Other Dictation: Dictation Number [id number]  PLAN OF CARE: Discharge to home after PACU  PATIENT DISPOSITION:  PACU - hemodynamically stable.   Delay start of Pharmacological VTE agent (>24hrs) due to surgical blood loss or risk of bleeding: yes

## 2018-08-07 NOTE — Transfer of Care (Signed)
Immediate Anesthesia Transfer of Care Note  Patient: Tracy Byrd  Procedure(s) Performed: DILATATION & CURETTAGE/HYSTEROSCOPY WITH MYOSURE (N/A )  Patient Location: PACU  Anesthesia Type:General  Level of Consciousness: awake, alert  and oriented  Airway & Oxygen Therapy: Patient Spontanous Breathing and Patient connected to nasal cannula oxygen  Post-op Assessment: Report given to RN, Post -op Vital signs reviewed and stable and Patient moving all extremities  Post vital signs: Reviewed and stable  Last Vitals:  Vitals Value Taken Time  BP 151/72 08/07/2018  3:53 PM  Temp    Pulse 102 08/07/2018  3:55 PM  Resp 17 08/07/2018  3:55 PM  SpO2 99 % 08/07/2018  3:55 PM  Vitals shown include unvalidated device data.  Last Pain:  Vitals:   08/07/18 1327  TempSrc: Oral  PainSc: 10-Worst pain ever      Patients Stated Pain Goal: 4 (09/62/83 6629)  Complications: No apparent anesthesia complications

## 2018-08-08 NOTE — Anesthesia Postprocedure Evaluation (Signed)
Anesthesia Post Note  Patient: Tracy Byrd  Procedure(s) Performed: DILATATION & CURETTAGE/HYSTEROSCOPY WITH MYOSURE (N/A )     Patient location during evaluation: PACU Anesthesia Type: General Level of consciousness: awake Pain management: pain level controlled Vital Signs Assessment: post-procedure vital signs reviewed and stable Respiratory status: spontaneous breathing Cardiovascular status: stable Postop Assessment: no apparent nausea or vomiting Anesthetic complications: no    Last Vitals:  Vitals:   08/07/18 1715 08/07/18 1800  BP: (!) 150/81 (!) 147/72  Pulse:  98  Resp:  16  Temp:  36.8 C  SpO2:  100%    Last Pain:  Vitals:   08/07/18 1800  TempSrc:   PainSc: 0-No pain   Pain Goal: Patients Stated Pain Goal: 4 (08/07/18 1615)               Huston Foley

## 2018-08-08 NOTE — Op Note (Signed)
NAME: Tracy Byrd, Tracy Byrd MEDICAL RECORD [id number] ACCOUNT [id number] DATE OF BIRTH:1955-12-24 FACILITY: [facility] LOCATION: [facility] PHYSICIAN:Stefany Starace Al Decant, MD  OPERATIVE REPORT  DATE OF PROCEDURE:  08/07/2018  PREOPERATIVE DIAGNOSES:  Postmenopausal bleeding and fibroids.  POSTOPERATIVE DIAGNOSES:  Postmenopausal bleeding and fibroids.  PROCEDURES:  Hysteroscopy, dilatation and curettage with MyoSure to resect the endometrial polyp mass.  SURGEON:  Thurnell Lose, MD  ASSISTANT:  None.  ANESTHESIA:  General and paracervical block.  ESTIMATED BLOOD LOSS:  20 mL.  BLOOD ADMINISTERED:  None.  DRAINS:  None.  LOCAL:  With 2% Xylocaine 10 mL.  SPECIMENS:  Polyp fibroid and endometrial curettings.  DISPOSITION OF SPECIMEN:  To pathology.  DISPOSITION:  To PACU hemodynamically stable.  COMPLICATIONS:  None.  FINDINGS:  Large fibroid uterus sounded to 11 cm.  Smooth atrophic appearing endometrium with multiple submucosal fibroids protruding into the cavity with polypoid mass on the posterior uterus cavity.  Initially with dark blood.  DESCRIPTION OF PROCEDURE:  The patient was identified in the holding area.  She was then taken to the operating room with IV running.  She underwent anesthesia without complication.  She was placed in the dorsal lithotomy position prior to induction of  anesthesia.  She was then prepped and draped in a normal sterile fashion.  A timeout was performed.  SCDs were on her legs and operating.  IV antibiotics were not hung.    A Graves speculum was then inserted into the vagina after the uterus was palpated.  I suspected uterus palpated about 14 week size, irregular and globular.  Graves speculum was inserted into the vagina; however, the posterior vagina was herniating into  the field.  A long Pederson speculum was then inserted.  Single tooth tenaculum was used to grasp the anterior lip of the cervix.  A paracervical block was performed.  An os finder  was then advanced through the endocervix and the os was dilated to Hegar  6.  The hysteroscope was then advanced through a sharply anterior cervix and once intra-abdominal access was confirmed, I saw murky, dark bloody fluid.  They had a new MyoSure system and the cavity did not clear and ultimately, there was a technical difficulty when the  machine cut off.  We had to get another machine.  Once the new machine was obtained, it took a little bit to clear out the cavity, but once the blood was removed and the pressure was increased to 110,  I was able to see the findings above.  The MyoSure  was used to resect the polyp and a portion of the fibroid for sampling.  All instruments were then removed from the vagina and a curettage of the uterus was performed.  I did the hysteroscope first and then did the D and C because we were having  difficulty with visualization.  Once I did the D and C and went back in, the cavity was cleared and I had not successfully got rid of the polypoid mass on the posterior side.  So I used the light MyoSure blade to remove the polyp which was done.  The  deficit was approximately 700-800.  All instruments were removed from the uterus.  The tenaculum was removed from the anterior lip of the cervix.  Graves was removed.  No bleeding noted.  All instrument, sponge and needle counts were correct x3.  The patient tolerated the procedure well and went to recovery room in stable condition.  AN/NUANCE  D:08/07/2018 T:08/08/2018 JOB:[id number]

## 2018-08-09 ENCOUNTER — Encounter (HOSPITAL_COMMUNITY): Payer: Self-pay | Admitting: Obstetrics and Gynecology

## 2019-06-23 ENCOUNTER — Other Ambulatory Visit: Payer: Self-pay | Admitting: Internal Medicine

## 2019-06-23 DIAGNOSIS — Z1231 Encounter for screening mammogram for malignant neoplasm of breast: Secondary | ICD-10-CM

## 2019-07-19 ENCOUNTER — Other Ambulatory Visit: Payer: Self-pay

## 2019-07-19 DIAGNOSIS — Z20822 Contact with and (suspected) exposure to covid-19: Secondary | ICD-10-CM

## 2019-07-21 LAB — NOVEL CORONAVIRUS, NAA: SARS-CoV-2, NAA: NOT DETECTED

## 2019-08-05 ENCOUNTER — Ambulatory Visit
Admission: RE | Admit: 2019-08-05 | Discharge: 2019-08-05 | Disposition: A | Discharge status: Home / Self Care | Payer: Medicare HMO | Source: Ambulatory Visit | Attending: Internal Medicine | Admitting: Internal Medicine

## 2019-08-05 ENCOUNTER — Other Ambulatory Visit: Payer: Self-pay

## 2019-08-05 DIAGNOSIS — Z1231 Encounter for screening mammogram for malignant neoplasm of breast: Secondary | ICD-10-CM

## 2019-11-26 IMAGING — MG DIGITAL DIAGNOSTIC UNILATERAL LEFT MAMMOGRAM
9 of 10 series · 9 of 10 positions shown · non-contrast
Comparison: Previous exam(s).

CLINICAL DATA: Left breast calcifications seen on most recent
screening mammography.

EXAM:
DIGITAL DIAGNOSTIC LEFT MAMMOGRAM WITH CAD

[L CC (1 of 3)]
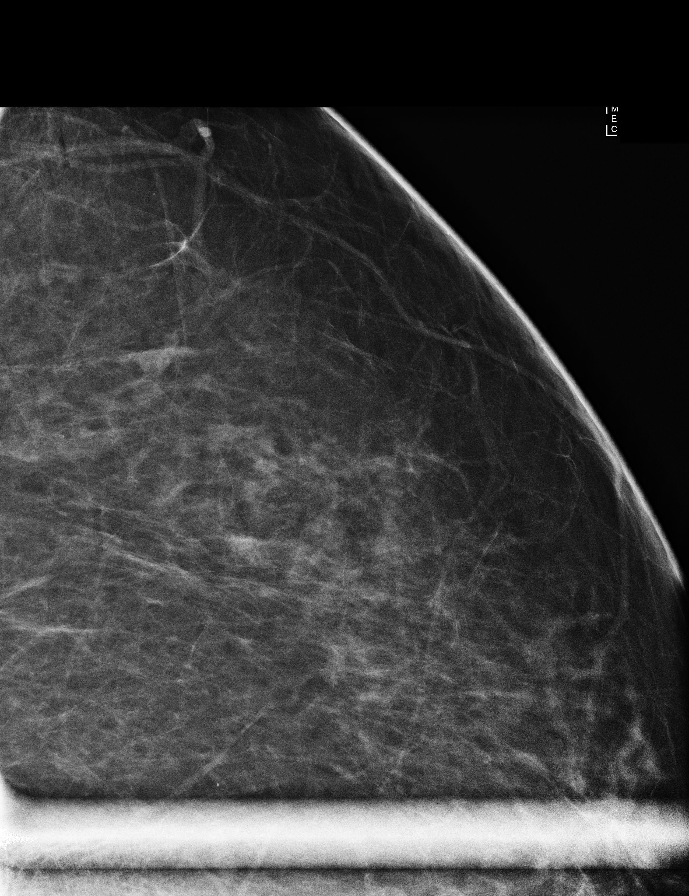

[L ML (1 of 5)]
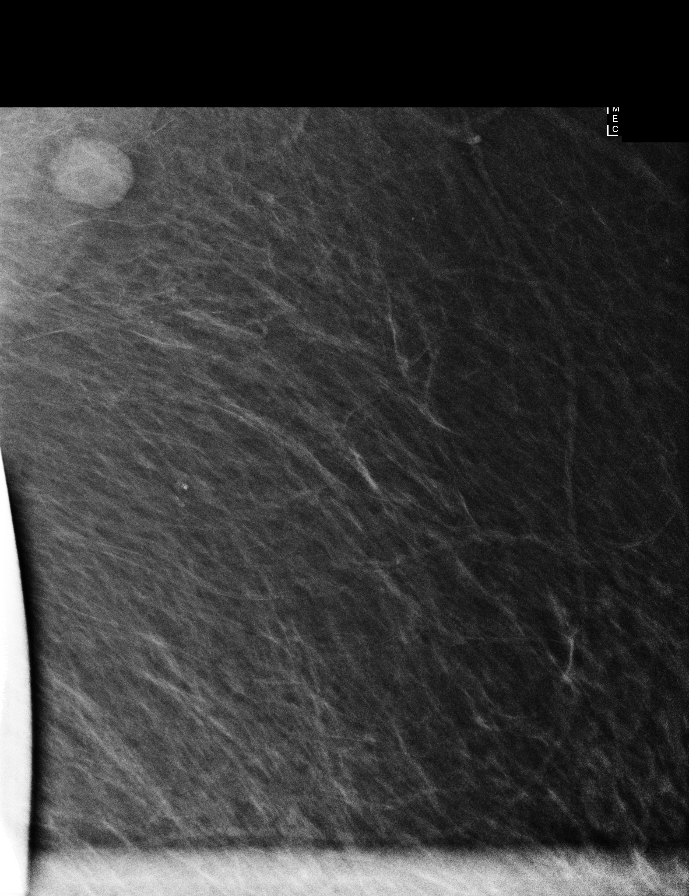

[L XCCL]
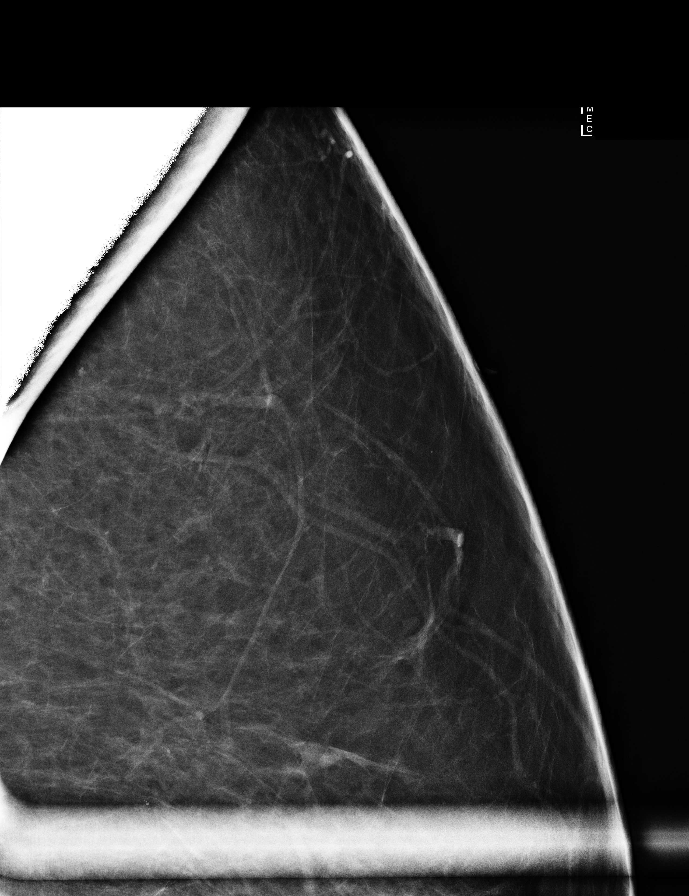

[L ML (2 of 5)]
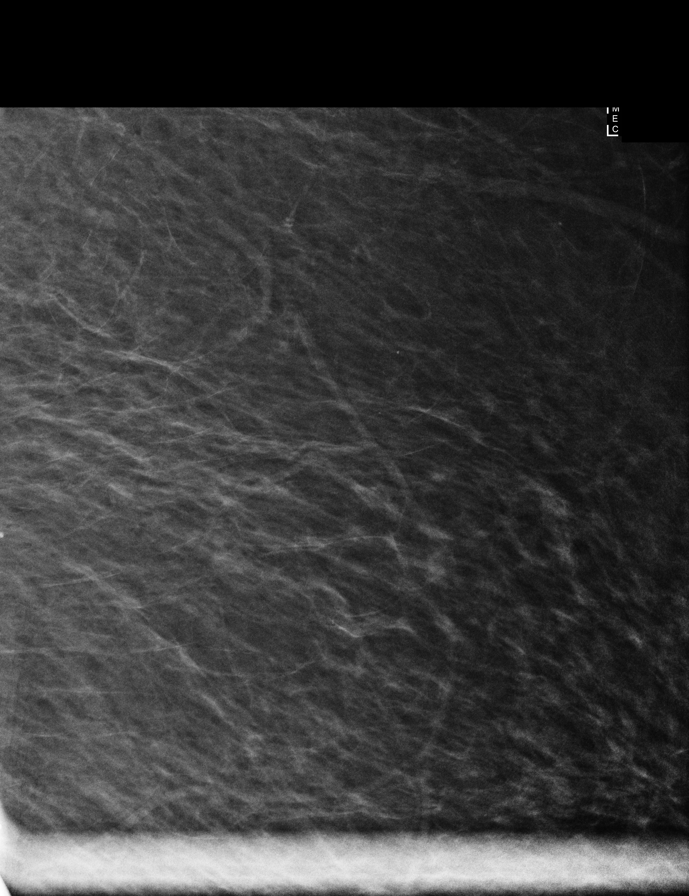

[L CC (2 of 3)]
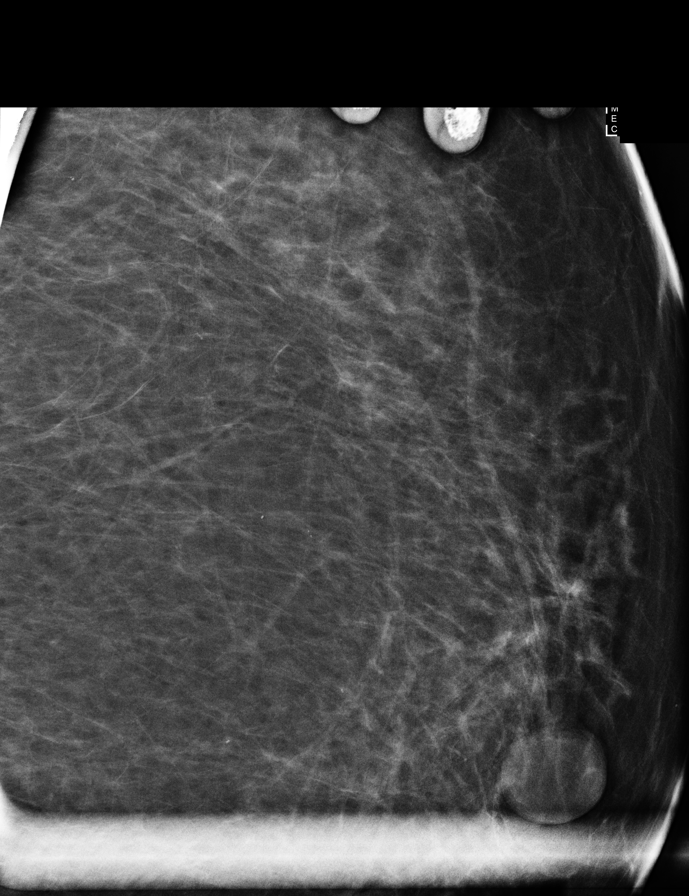

[L ML (3 of 5)]
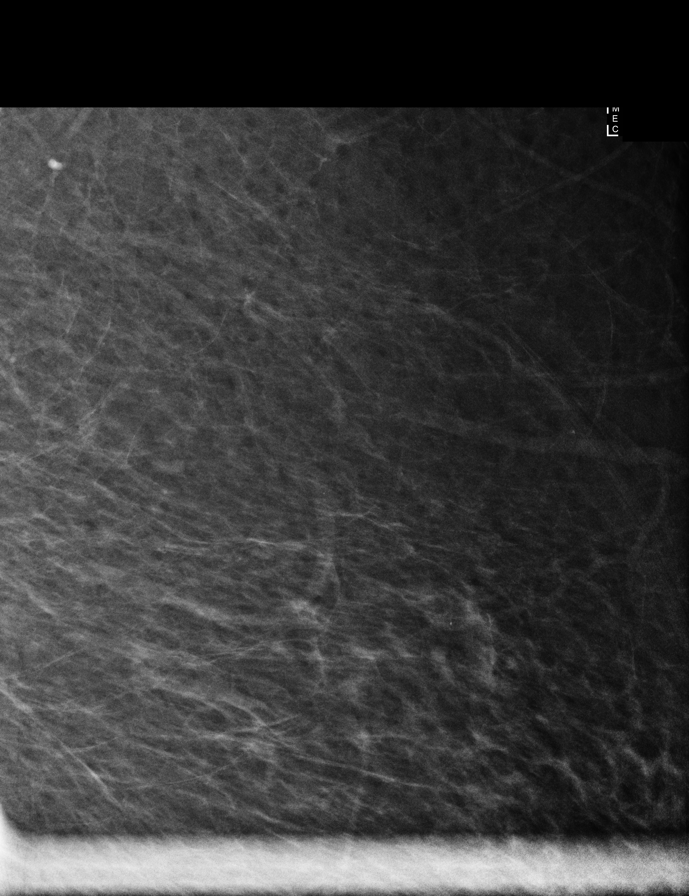

[L ML (4 of 5)]
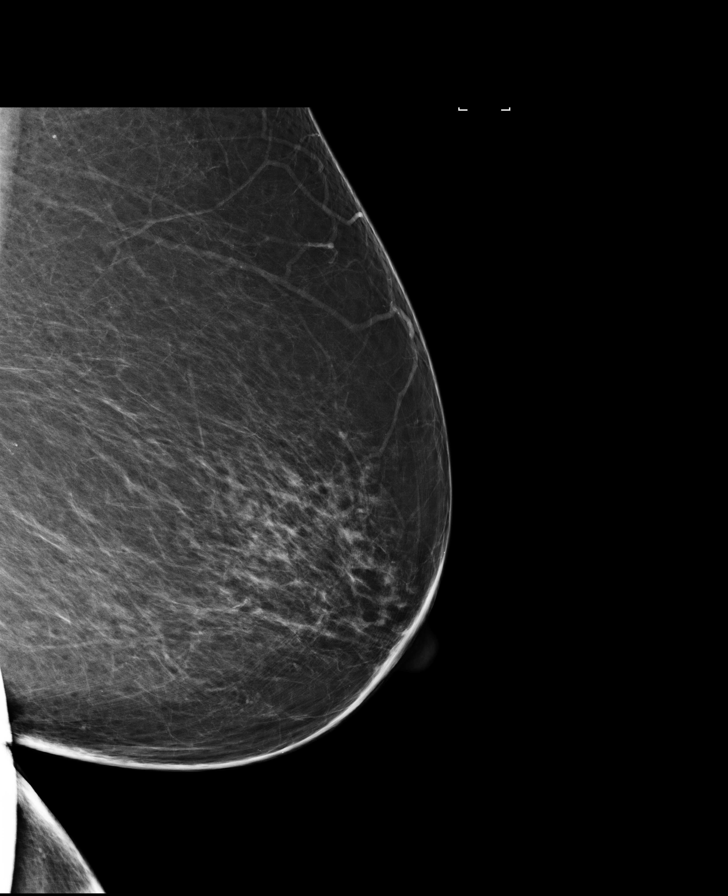

[L CC (3 of 3)]
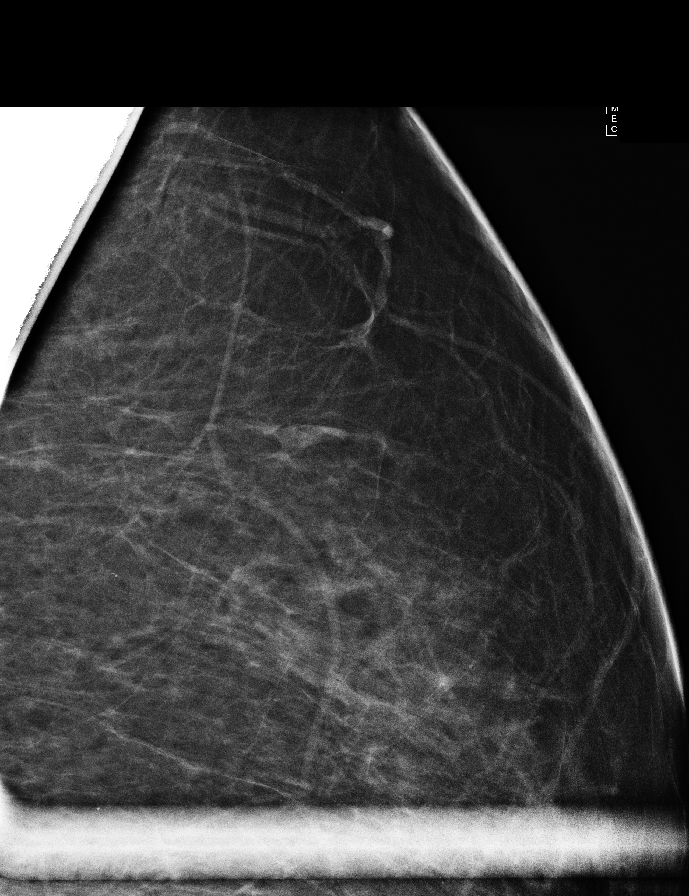

[L ML (5 of 5)]
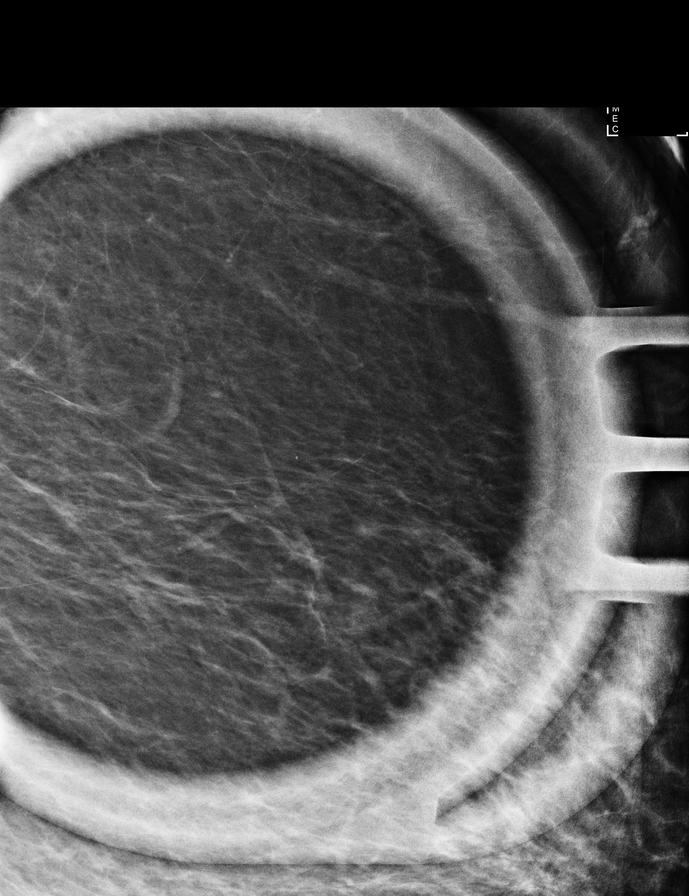

[9 of 10 positions shown; findings below may reference images not displayed]

ACR Breast Density Category b: There are scattered areas of
fibroglandular density.
FINDINGS: Additional mammographic views of the left breast demonstrate
benign-appearing, possibly vascular, calcifications in the left
breast slightly upper outer quadrant, posterior depth. No suspicious
masses are seen.

Mammographic images were processed with CAD.
IMPRESSION: No mammographic evidence of malignancy in the left breast.

RECOMMENDATION:
Screening mammogram in one year.(Code:[id number])

I have discussed the findings and recommendations with the patient.
Results were also provided in writing at the conclusion of the
visit. If applicable, a reminder letter will be sent to the patient
regarding the next appointment.

BI-RADS CATEGORY  2: Benign.

## 2019-12-04 ENCOUNTER — Ambulatory Visit: Payer: Medicare HMO | Attending: Internal Medicine

## 2019-12-04 DIAGNOSIS — Z23 Encounter for immunization: Secondary | ICD-10-CM

## 2019-12-04 NOTE — Progress Notes (Signed)
   Covid-19 Vaccination Clinic  Name:  Tracy Byrd    MRN: [id number] DOB: 02/06/1956  12/04/2019  [name] was observed post Covid-19 immunization for 15 minutes without incident. She was provided with Vaccine Information Sheet and instruction to access the V-Safe system.   [name] was instructed to call 911 with any severe reactions post vaccine: Marland Kitchen Difficulty breathing  . Swelling of face and throat  . A fast heartbeat  . A bad rash all over body  . Dizziness and weakness   Immunizations Administered    Name Date Dose VIS Date Route   Pfizer COVID-19 Vaccine 12/04/2019 11:01 AM 0.3 mL 08/22/2019 Intramuscular   Manufacturer: [place]   Lot: [id number]   [place]: [id number]

## 2019-12-29 ENCOUNTER — Ambulatory Visit: Payer: Medicare HMO | Attending: Internal Medicine

## 2019-12-29 DIAGNOSIS — Z23 Encounter for immunization: Secondary | ICD-10-CM

## 2019-12-29 NOTE — Progress Notes (Signed)
   Covid-19 Vaccination Clinic  Name:  Tracy Byrd    MRN: [id number] DOB: [birth year]  12/29/2019  Ms. [name] was observed post Covid-19 immunization for 15 minutes without incident. She was provided with Vaccine Information Sheet and instruction to access the V-Safe system.   [name] was instructed to call 911 with any severe reactions post vaccine: Marland Kitchen Difficulty breathing  . Swelling of face and throat  . A fast heartbeat  . A bad rash all over body  . Dizziness and weakness   Immunizations Administered    Name Date Dose VIS Date Route   Pfizer COVID-19 Vaccine 12/29/2019 10:17 AM 0.3 mL 11/05/2018 Intramuscular   Manufacturer: [place]   Lot: [id number]   [place]: [id number]

## 2020-07-09 ENCOUNTER — Other Ambulatory Visit: Payer: Self-pay | Admitting: Internal Medicine

## 2020-07-09 DIAGNOSIS — Z1231 Encounter for screening mammogram for malignant neoplasm of breast: Secondary | ICD-10-CM

## 2020-08-02 ENCOUNTER — Ambulatory Visit: Payer: Medicare HMO | Attending: Internal Medicine

## 2020-08-02 DIAGNOSIS — Z23 Encounter for immunization: Secondary | ICD-10-CM

## 2020-08-02 NOTE — Progress Notes (Signed)
   Covid-19 Vaccination Clinic  Name:  Tracy Byrd    MRN: [id number] DOB: 1956/03/20  08/02/2020  Tracy Byrd was observed post Covid-19 immunization for 15 minutes without incident. She was provided with Vaccine Information Sheet and instruction to access the V-Safe system.   Tracy Byrd was instructed to call 911 with any severe reactions post vaccine: Marland Kitchen Difficulty breathing  . Swelling of face and throat  . A fast heartbeat  . A bad rash all over body  . Dizziness and weakness   Immunizations Administered    Name Date Dose VIS Date Route   Pfizer COVID-19 Vaccine 08/02/2020  4:33 PM 0.3 mL 06/30/2020 Intramuscular   Manufacturer: [place]   Lot: [id number]   [place]: [id number]

## 2020-08-13 ENCOUNTER — Ambulatory Visit
Admission: RE | Admit: 2020-08-13 | Discharge: 2020-08-13 | Disposition: A | Discharge status: Home / Self Care | Payer: Medicare HMO | Source: Ambulatory Visit | Attending: Internal Medicine | Admitting: Internal Medicine

## 2020-08-13 ENCOUNTER — Other Ambulatory Visit: Payer: Self-pay

## 2020-08-13 DIAGNOSIS — Z1231 Encounter for screening mammogram for malignant neoplasm of breast: Secondary | ICD-10-CM

## 2020-09-14 ENCOUNTER — Other Ambulatory Visit: Payer: Self-pay

## 2020-09-14 ENCOUNTER — Ambulatory Visit: Payer: Medicare HMO

## 2020-09-14 ENCOUNTER — Encounter (HOSPITAL_COMMUNITY): Payer: Self-pay

## 2020-09-14 ENCOUNTER — Ambulatory Visit (HOSPITAL_COMMUNITY)
Admission: EM | Admit: 2020-09-14 | Discharge: 2020-09-14 | Disposition: A | Discharge status: Home / Self Care | Payer: Medicare HMO | Attending: Family Medicine | Admitting: Family Medicine

## 2020-09-14 DIAGNOSIS — R509 Fever, unspecified: Secondary | ICD-10-CM | POA: Insufficient documentation

## 2020-09-14 DIAGNOSIS — Z794 Long term (current) use of insulin: Secondary | ICD-10-CM | POA: Insufficient documentation

## 2020-09-14 DIAGNOSIS — R6889 Other general symptoms and signs: Secondary | ICD-10-CM | POA: Insufficient documentation

## 2020-09-14 DIAGNOSIS — Z791 Long term (current) use of non-steroidal anti-inflammatories (NSAID): Secondary | ICD-10-CM | POA: Insufficient documentation

## 2020-09-14 DIAGNOSIS — Z888 Allergy status to other drugs, medicaments and biological substances status: Secondary | ICD-10-CM | POA: Diagnosis not present

## 2020-09-14 DIAGNOSIS — Z9049 Acquired absence of other specified parts of digestive tract: Secondary | ICD-10-CM | POA: Diagnosis not present

## 2020-09-14 DIAGNOSIS — Z20822 Contact with and (suspected) exposure to covid-19: Secondary | ICD-10-CM | POA: Diagnosis not present

## 2020-09-14 DIAGNOSIS — Z79899 Other long term (current) drug therapy: Secondary | ICD-10-CM | POA: Diagnosis not present

## 2020-09-14 LAB — RESP PANEL BY RT-PCR (FLU A&B, COVID) ARPGX2
Influenza A by PCR: NEGATIVE
Influenza B by PCR: NEGATIVE
SARS Coronavirus 2 by RT PCR: NEGATIVE

## 2020-09-14 NOTE — ED Provider Notes (Signed)
[facility]    CSN: [id number] Arrival date & time: 09/14/20  1353      History   Chief Complaint Chief Complaint  Patient presents with  . Headache  . Generalized Body Aches  . Chills    HPI Tracy Byrd is a 65 y.o. female.   Pt having chills and fever since Sunday. She had a temp of 101.2 on Sunday. She endorses ability to taste and smell but says things taste 'bitter'.  Daughter also has sore throat before pt started developing symptoms.  She also endorses headache for a few days.  No nausea.  She did vomit one time last night, nonbloody.  No diarrhea or abdominal pain.  She states she does not have an appetite.  She has been taking her blood pressure medication and insulin but not tylenol.  She took alkaseltzer daytime otc medication.  Patient had covid booster and flu shot.  Has no other sick contacts other than daughter.  She has not been outside the house until today.    She endorses DM, HTN, and HLD for which she takes medication.       Past Medical History:  Diagnosis Date  . Back pain, chronic   . Degenerative disc disease   . Diabetes mellitus without complication (HCC)   . Hypertension   . Obesity     There are no problems to display for this patient.   Past Surgical History:  Procedure Laterality Date  . CESAREAN SECTION    . CHOLECYSTECTOMY    . DILATATION & CURETTAGE/HYSTEROSCOPY WITH MYOSURE N/A 08/07/2018   Procedure: DILATATION & CURETTAGE/HYSTEROSCOPY WITH MYOSURE;  Surgeon: Geryl Rankins, MD;  Location: [facility];  Service: Gynecology;  Laterality: N/A;  rep will be here confirmed on 07/29/18  . HERNIA REPAIR    . MANDIBLE FRACTURE SURGERY     both jaws  . TONSILLECTOMY      OB History   No obstetric history on file.      Home Medications    Prior to Admission medications   Medication Sig Start Date End Date Taking? Authorizing Provider  allopurinol (ZYLOPRIM) 300 MG tablet Take 300 mg by mouth daily.    [provider]  atorvastatin (LIPITOR) 10 MG tablet Take 10 mg by mouth at bedtime.    [provider]  Cholecalciferol (VITAMIN D3) 125 MCG (5000 UT) CAPS Take 5,000 Units by mouth daily.    [provider]  diclofenac sodium (VOLTAREN) 1 % GEL Apply 1 application topically at bedtime.    [provider]  ibuprofen (ADVIL,MOTRIN) 600 MG tablet Take 1 tablet (600 mg total) by mouth every 6 (six) hours as needed. 08/07/18   Geryl Rankins, MD  insulin NPH-regular Human (70-30) 100 UNIT/ML injection Inject 15 Units into the skin daily with breakfast.    [provider]  lisinopril-hydrochlorothiazide (PRINZIDE,ZESTORETIC) 20-25 MG per tablet Take 1 tablet by mouth daily. 08/19/12   Johnson, Clanford L, MD  Magnesium 250 MG TABS Take 250 mg by mouth daily.    [provider]  meloxicam (MOBIC) 15 MG tablet Take 15 mg by mouth daily.    [provider]  omeprazole (PRILOSEC) 20 MG capsule Take 20 mg by mouth daily.    [provider]  Semaglutide, 1 MG/DOSE, (OZEMPIC, 1 MG/DOSE,) 2 MG/1.5ML SOPN Inject 1 mg into the skin every Saturday.    [provider]    Family History History reviewed. No pertinent family history.  Social  History Social History   Tobacco Use  . Smoking status: Never Smoker  . Smokeless tobacco: Never Used  Vaping Use  . Vaping Use: Never used  Substance Use Topics  . Alcohol use: No  . Drug use: No     Allergies   Tramadol   Review of Systems Review of Systems  All other systems reviewed and are negative.    Physical Exam Triage Vital Signs ED Triage Vitals  Enc Vitals Group     BP 09/14/20 1541 (!) 152/65     Pulse Rate 09/14/20 1541 (!) 103     Resp 09/14/20 1541 (!) 25     Temp 09/14/20 1541 (!) 100.6 F (38.1 C)     Temp Source 09/14/20 1541 Oral     SpO2 09/14/20 1541 100 %     Weight --      Height --      Head Circumference --      Peak Flow --      Pain Score 09/14/20 1540 6      Pain Loc --      Pain Edu? --      Excl. in GC? --    No data found.  Updated Vital Signs BP (!) 152/65 (BP Location: Right Arm)   Pulse (!) 103   Temp (!) 100.6 F (38.1 C) (Oral)   Resp (!) 25   LMP 08/07/2012   SpO2 100%   Visual Acuity Right Eye Distance:   Left Eye Distance:   Bilateral Distance:    Right Eye Near:   Left Eye Near:    Bilateral Near:     Physical Exam Vitals reviewed.  Constitutional:      Appearance: She is normal weight. She is ill-appearing.  HENT:     Nose: Nose normal. No congestion.     Mouth/Throat:     Mouth: Mucous membranes are moist.     Pharynx: Oropharynx is clear.  Cardiovascular:     Rate and Rhythm: Normal rate and regular rhythm.     Pulses: Normal pulses.     Heart sounds: No murmur heard.   Pulmonary:     Effort: Pulmonary effort is normal.     Comments: Mild bilateral wheezing Abdominal:     General: There is no distension.     Palpations: Abdomen is soft.     Tenderness: There is no abdominal tenderness.  Musculoskeletal:     Cervical back: Normal range of motion.  Skin:    General: Skin is warm and dry.  Neurological:     Mental Status: She is alert.  Psychiatric:        Mood and Affect: Mood normal.        Behavior: Behavior normal.      UC Treatments / Results  Labs (all labs ordered are listed, but only abnormal results are displayed) Labs Reviewed  RESP PANEL BY RT-PCR (FLU A&B, COVID) [id number]    EKG   Radiology No results found.  Procedures Procedures (including critical care time)  Medications Ordered in UC Medications - No data to display  Initial Impression / Assessment and Plan / UC Course  I have reviewed the triage vital signs and the nursing notes.  Pertinent labs & imaging results that were available during my care of the patient were reviewed by me and considered in my medical decision making (see chart for details).     Patient with symptoms concerning for viral infection  including fever, chills, headache.  Daughter became ill a few days before she did.  Patient vaccinated against Covid and.  Also has had her booster.  Will test for Covid and flu today, although it is more likely to be different virus.  Discussed symptomatic treatment with patient.  Advised her to take ibuprofen and Tylenol.  Patient denies history of kidney or heart disease.  Advised her not to take the Alka-Seltzer that she was taking due to the presence of phenylephrine given her history of hypertension.  Discussed isolation and return precautions including signs of respiratory distress.  Final Clinical Impressions(s) / UC Diagnoses   Final diagnoses:  Flu-like symptoms     Discharge Instructions     You have symptoms consistent with a viral infection, possibly Covid or flu.  Considering that you have been vaccinated for both we will test for both at this time.  You should isolate yourself from others until you get results back of the test which can take 24 to 48 hours.  If you develop any difficulty breathing you should be seen again by a provider in the urgent care or emergency department..  You can treat your symptoms with ibuprofen and Tylenol.  Please try to drink plenty of water.      ED Prescriptions    None     PDMP not reviewed this encounter.   Benay Pike, MD 09/14/20 1640

## 2020-09-14 NOTE — ED Triage Notes (Signed)
Pt presents with headache, body aches and chills x 3 days. Denies shortness of breath, nasal congestion, cough.

## 2020-09-14 NOTE — Discharge Instructions (Signed)
You have symptoms consistent with a viral infection, possibly Covid or flu.  Considering that you have been vaccinated for both we will test for both at this time.  You should isolate yourself from others until you get results back of the test which can take 24 to 48 hours.  If you develop any difficulty breathing you should be seen again by a provider in the urgent care or emergency department..  You can treat your symptoms with ibuprofen and Tylenol.  Please try to drink plenty of water.

## 2022-01-23 ENCOUNTER — Other Ambulatory Visit: Payer: Self-pay | Admitting: Internal Medicine

## 2022-01-23 DIAGNOSIS — Z1231 Encounter for screening mammogram for malignant neoplasm of breast: Secondary | ICD-10-CM

## 2022-01-26 ENCOUNTER — Ambulatory Visit
Admission: RE | Admit: 2022-01-26 | Discharge: 2022-01-26 | Disposition: A | Discharge status: Home / Self Care | Payer: Medicare HMO | Source: Ambulatory Visit | Attending: Internal Medicine | Admitting: Internal Medicine

## 2022-01-26 DIAGNOSIS — Z1231 Encounter for screening mammogram for malignant neoplasm of breast: Secondary | ICD-10-CM

## 2022-01-27 ENCOUNTER — Other Ambulatory Visit: Payer: Self-pay | Admitting: Internal Medicine

## 2022-01-27 DIAGNOSIS — R928 Other abnormal and inconclusive findings on diagnostic imaging of breast: Secondary | ICD-10-CM

## 2022-02-08 ENCOUNTER — Ambulatory Visit
Admission: RE | Admit: 2022-02-08 | Discharge: 2022-02-08 | Disposition: A | Discharge status: Home / Self Care | Payer: Medicare HMO | Source: Ambulatory Visit | Attending: Internal Medicine | Admitting: Internal Medicine

## 2022-02-08 ENCOUNTER — Other Ambulatory Visit: Payer: Self-pay | Admitting: Internal Medicine

## 2022-02-08 DIAGNOSIS — R928 Other abnormal and inconclusive findings on diagnostic imaging of breast: Secondary | ICD-10-CM

## 2022-02-08 DIAGNOSIS — N6489 Other specified disorders of breast: Secondary | ICD-10-CM

## 2022-08-11 ENCOUNTER — Other Ambulatory Visit: Payer: Medicare HMO

## 2023-04-23 HISTORY — PX: COLONOSCOPY WITH PROPOFOL: SHX5780

## 2023-06-13 ENCOUNTER — Encounter (HOSPITAL_BASED_OUTPATIENT_CLINIC_OR_DEPARTMENT_OTHER): Payer: Self-pay | Admitting: Obstetrics and Gynecology

## 2023-06-13 NOTE — Progress Notes (Signed)
Spoke w/ via phone for pre-op interview--- pt Lab needs dos----  [employer], ekg       Lab results------ no COVID test -----patient states asymptomatic no test needed Arrive at ------- 0630 on 06-20-2023 NPO after MN  Med rec completed Medications to take morning of surgery ----- prilosec Diabetic medication ----- pt verbalized understanding do half dose toujeo morning of surgery.  Pt stated was not given any instructions about ozempic.  Pt does it on Saturday's .  Last dose 06-09-2023.  Pt verbalized understanding to not do until after surgery and why. Patient instructed no nail polish to be worn day of surgery Patient instructed to bring photo id and insurance card day of surgery Patient aware to have Driver (ride ) / caregiver    for 24 hours after surgery - friend, [employer] clerk Patient Special Instructions -----  n/a Pre-Op special Instructions ----- case just added today,  pre-op orders pending Patient verbalized understanding of instructions that were given at this phone interview. Patient denies chest pain, sob, fever, cough at the interview.

## 2023-06-19 ENCOUNTER — Other Ambulatory Visit: Payer: Self-pay | Admitting: Obstetrics and Gynecology

## 2023-06-19 DIAGNOSIS — N950 Postmenopausal bleeding: Secondary | ICD-10-CM

## 2023-06-19 NOTE — Anesthesia Preprocedure Evaluation (Signed)
Anesthesia Evaluation  Patient identified by MRN, date of birth, ID band Patient awake    Reviewed: Allergy & Precautions, NPO status , Patient's Chart, lab work & pertinent test results  History of Anesthesia Complications Negative for: history of anesthetic complications  Airway Mallampati: III  TM Distance: >3 FB Neck ROM: Full    Dental  (+) Edentulous Upper, Edentulous Lower   Pulmonary former smoker   Pulmonary exam normal        Cardiovascular hypertension, Pt. on medications Normal cardiovascular exam     Neuro/Psych negative neurological ROS  negative psych ROS   GI/Hepatic Neg liver ROS,GERD  Medicated and Controlled,,  Endo/Other  diabetes, Type 2, Insulin Dependent  Morbid obesity  Renal/GU negative Renal ROS     Musculoskeletal  (+) Arthritis , Osteoarthritis,    Abdominal  (+) + obese  Peds  Hematology negative hematology ROS (+)   Anesthesia Other Findings On GLP-1a, last dose over 7 days ago    Reproductive/Obstetrics                             Anesthesia Physical Anesthesia Plan  ASA: 3  Anesthesia Plan: General   Post-op Pain Management: Tylenol PO (pre-op)*   Induction: Intravenous  PONV Risk Score and Plan: 3 and Treatment may vary due to age or medical condition, Ondansetron and Dexamethasone  Airway Management Planned: LMA  Additional Equipment: None  Intra-op Plan:   Post-operative Plan: Extubation in OR  Informed Consent: I have reviewed the patients History and Physical, chart, labs and discussed the procedure including the risks, benefits and alternatives for the proposed anesthesia with the patient or authorized representative who has indicated his/her understanding and acceptance.     Dental advisory given  Plan Discussed with: CRNA and Anesthesiologist  Anesthesia Plan Comments:         Anesthesia Quick Evaluation

## 2023-06-19 NOTE — H&P (Deleted)
  The note originally documented on this encounter has been moved the the encounter in which it belongs.  

## 2023-06-19 NOTE — H&P (Signed)
Subjective:    Chief Complaint(s): postmenopausal bleeding/ endometrial mass    HPI:      General 67 y/o presents for preop visit. Pt is schedule for a hysteroscopy D&C and polypectomy on 06/20/2023 for the management of PMB and endometrial polyp.   IN REVIEW:  Visit on 03/20/2023, Pt reported recent episode of PMB on 03/14/23 ; She bleed several days like a period. She has had several episodes of bleeding in past two years like a menses. Has noticed that in past year, she has bleed 3 different times. Denies abdomenal or pelvic pain or urinary symptoms. She reports prior history of first PMB episode in 05/2018 and seen by Dr. Dion Body. Had Endometrial curretage 08/07/18 with findings of endometrium polyp and fibroid. EMB on 03/20/2023 was benign with noted endometrial polyp.    Her gyn u/s on 03/28/2023  uterus measuring 14 cm x 11 cm x 11 cm . showed multiple fibroids and thickened endometrial lining. 8.4 mm. . also showed  endometrial mass polyp vs fibroid measuring 2.5 cm.    Current Medication:     Taking Metamucil Premium Blend(Psyllium) 52.63 % Powder as directed Orally. Toujeo SoloStar(Insulin Glargine (1 Unit Dial)) 300 UNIT/ML Solution Pen-injector as directed Subcutaneous. Vitamin B Complex - Tablet as directed Orally. Multivitamin. Ozempic 1 MG/DOSE Solution Pen-injector 1 mg weekly Subcutaneous once a week. Allopurinol 300 MG Tablet as directed Orally. Atorvastatin Calcium 10 MG Tablet 1 tablet Orally Once a day. Lisinopril 20 MG Tablet 1 tablet Orally Once a day. Omeprazole 20 MG Capsule Delayed Release 1 capsule Orally Once a day.     Not-Taking Provera 10 MG Tablet 1 tablet with food Orally Once a day. Medication List reviewed and reconciled with the patient.    Medical History: diabetes mellitus-Dr. [facility], [place] hypertension degenerative arthritis    Allergies/Intolerance:      N.K.D.A.    Gyn History: Sexual activity not currently sexually active.   Periods : postmenopausal.  LMP Spotting every once in a while.  Denies Birth control.  Last pap smear date 06/05/2018 Neg/HPVneg.  [facility]  Denies Abnormal pap smear.  Denies STD.     OB History: Number of pregnancies  1.  Pregnancy # 1  live birth, C-section delivery, girl (pt was 34 when pregnant).     Surgical History: Tonsillectomy 1978 Gall Stones 1982 C section 2002 hernia 2009 hysteroscopy D&C polypectomy with Dr. Dion Body 2019    Hospitalization: No Hospitalization History.    Family History: denies any GYN family cancer hx No Family History of Colon Cancer, Polyps, or Liver Disease .    Social History:      General Tobacco use:   cigarettes:  Former smoker started in 1996, quit 2002, Quit in year  2002, Tobacco history last updated  06/15/2023, Vaping  No.  Alcohol:  no rare. Caffeine:  coffee, tea, soda. Recreational drug use:  No. DIET:  no particular dietary program. Exercise:  NONE, walks. Marital Status:  single.   Children:  1, daughter (s).    ROS:      CONSTITUTIONAL Chills  No.  Fatigue  No.  Fever  No.  Night sweats  No.  Recent travel outside [place]  No.  Sweats  No.  Weight change  No.       OPHTHALMOLOGY Blurring of vision  no.  Change in vision  no.  Double vision  no.       ENT Dizziness  no.  Nose  bleeds  no.  Sore throat  no.  Teeth pain  no.       ALLERGY Hives  no.       CARDIOLOGY Chest pain  no.  High blood pressure  no.  Irregular heart beat  no.  Leg edema  no.  Palpitations  no.       RESPIRATORY Shortness of breath  no.  Cough  no.  Wheezing  no.       UROLOGY Pain with urination  no.  Urinary urgency  no.  Urinary frequency  no.  Urinary incontinence  no.  Difficulty urinating  No.  Blood in urine  No.       GASTROENTEROLOGY Abdominal pain  no.  Appetite change  no.  Bloating/belching  no.  Blood in stool or on toilet paper  no.  Change in bowel movements  no.  Constipation  no.  Diarrhea  no.  Difficulty  swallowing  no.  Nausea  no.       FEMALE REPRODUCTIVE Vulvar pain  no.  Vulvar rash  no.  Abnormal vaginal bleeding  postmenopausal bleeding.  Breast pain  no.  Nipple discharge  no.  Pain with intercourse  no.  Pelvic pain  no.  Unusual vaginal discharge  no.  Vaginal itching  no.       MUSCULOSKELETAL Muscle aches  no.       NEUROLOGY Headache  no.  Tingling/numbness  no.  Weakness  no.       PSYCHOLOGY Depression  no.  Anxiety  no.  Nervousness  no.  Sleep disturbances  no.  Suicidal ideation  no .       ENDOCRINOLOGY Excessive thirst  no.  Excessive urination  no.  Hair loss  no.  Heat or cold intolerance  no.       HEMATOLOGY/LYMPH Abnormal bleeding  no.  Easy bruising  no.  Swollen glands  no.       DERMATOLOGY New/changing skin lesion  no.  Rash  no.  Sores  no.  Negative except as stated in HPI.   Objective:    Vitals: Wt: 246.4, Wt change: -7.8 lbs, Ht: 63, BMI: 43.64, Pulse sitting: 88, BP sitting: 120/76.    Past Results:    Examination:      General Examination CONSTITUTIONAL: alert, oriented, NAD.  SKIN:  moist, warm.  EYES:  Conjunctiva clear.  LUNGS: good I:E efffort noted, clear to auscultation bilaterally.  HEART: regular rate and rhythm.  ABDOMEN: soft, non-tender/non-distended, bowel sounds present.  FEMALE GENITOURINARY: normal external genitalia, labia - unremarkable, vagina - pink moist mucosa, no lesions or abnormal discharge, cervix - no discharge or lesions or CMT, adnexa - no masses or tenderness, uterus - nontender and 14 week size on palpation.  EXTREMITIES: trace edema.  PSYCH:  affect normal, good eye contact.     Physical Examination:      Chaperone present Chaperone present Debby Freiberg 06/15/2023 04:24:54 PM >, for pelvic exam.    Assessment:    Assessment: Postmenopausal bleeding - N95.0 (Primary)    Endometrial polyp - N84.0    Fibroids, intramural - D25.1      Plan:    Treatment:     Postmenopausal bleeding     Notes: Planning  hysteroscopy D/C possible polypectomy with myosure. Pt is advised she will be able to return home the same day if she is doing well. Discussed risks of hysteroscopy including but not limited to infection, bleeding, possible perforation of  the uterus, with the need for further surgery. Pt advised to avoid NSAIDs (Aspirin, Aleve, Advil, Ibuprofen, Motrin) from now until surgery given risk of bleeding during surgery. She may take Tylenol for pain management. She is advised to avoid eating or drinking starting midnight prior to surgery. Discussed post-surgery avoidance of driving for 24 hours or intercourse for 2 weeks after procedure.     Endometrial polyp     Notes: Planning hysteroscopy D/C possible polypectomy with myosure. Pt is advised she will be able to return home the same day if she is doing well. Discussed risks of hysteroscopy including but not limited to infection, bleeding, possible perforation of the uterus, with the need for further surgery. Pt advised to avoid NSAIDs (Aspirin, Aleve, Advil, Ibuprofen, Motrin) from now until surgery given risk of bleeding during surgery. She may take Tylenol for pain management. She is advised to avoid eating or drinking starting midnight prior to surgery. Discussed post-surgery avoidance of driving for 24 hours or intercourse for 2 weeks after procedure.     Fibroids, intramural     Notes: Planning hysteroscopy D/C possible polypectomy with myosure. Pt is advised she will be able to return home the same day if she is doing well. Discussed risks of hysteroscopy including but not limited to infection, bleeding, possible perforation of the uterus, with the need for further surgery. Pt advised to avoid NSAIDs (Aspirin, Aleve, Advil, Ibuprofen, Motrin) from now until surgery given risk of bleeding during surgery. She may take Tylenol for pain management. She is advised to avoid eating or drinking starting midnight prior to surgery. Discussed post-surgery avoidance of  driving for 24 hours or intercourse for 2 weeks after procedure.

## 2023-06-20 ENCOUNTER — Encounter (HOSPITAL_BASED_OUTPATIENT_CLINIC_OR_DEPARTMENT_OTHER): Payer: Self-pay | Admitting: Obstetrics and Gynecology

## 2023-06-20 ENCOUNTER — Other Ambulatory Visit: Payer: Self-pay

## 2023-06-20 ENCOUNTER — Ambulatory Visit (HOSPITAL_BASED_OUTPATIENT_CLINIC_OR_DEPARTMENT_OTHER)
Admission: RE | Admit: 2023-06-20 | Discharge: 2023-06-20 | Disposition: A | Discharge status: Home / Self Care | Payer: Medicare HMO | Attending: Obstetrics and Gynecology | Admitting: Obstetrics and Gynecology

## 2023-06-20 ENCOUNTER — Encounter (HOSPITAL_BASED_OUTPATIENT_CLINIC_OR_DEPARTMENT_OTHER)
Admission: RE | Disposition: A | Discharge status: Home / Self Care | Payer: Self-pay | Source: Home / Self Care | Attending: Obstetrics and Gynecology

## 2023-06-20 ENCOUNTER — Ambulatory Visit (HOSPITAL_BASED_OUTPATIENT_CLINIC_OR_DEPARTMENT_OTHER): Payer: Medicare HMO | Admitting: Anesthesiology

## 2023-06-20 DIAGNOSIS — N840 Polyp of corpus uteri: Secondary | ICD-10-CM | POA: Diagnosis present

## 2023-06-20 DIAGNOSIS — Z01818 Encounter for other preprocedural examination: Secondary | ICD-10-CM | POA: Insufficient documentation

## 2023-06-20 DIAGNOSIS — D251 Intramural leiomyoma of uterus: Secondary | ICD-10-CM | POA: Insufficient documentation

## 2023-06-20 DIAGNOSIS — Z87891 Personal history of nicotine dependence: Secondary | ICD-10-CM | POA: Diagnosis not present

## 2023-06-20 DIAGNOSIS — I10 Essential (primary) hypertension: Secondary | ICD-10-CM | POA: Insufficient documentation

## 2023-06-20 DIAGNOSIS — N950 Postmenopausal bleeding: Secondary | ICD-10-CM | POA: Diagnosis not present

## 2023-06-20 DIAGNOSIS — K219 Gastro-esophageal reflux disease without esophagitis: Secondary | ICD-10-CM | POA: Insufficient documentation

## 2023-06-20 DIAGNOSIS — E119 Type 2 diabetes mellitus without complications: Secondary | ICD-10-CM | POA: Insufficient documentation

## 2023-06-20 DIAGNOSIS — Z7985 Long-term (current) use of injectable non-insulin antidiabetic drugs: Secondary | ICD-10-CM | POA: Diagnosis not present

## 2023-06-20 HISTORY — DX: Hyperlipidemia, unspecified: E78.5

## 2023-06-20 HISTORY — DX: Presence of spectacles and contact lenses: Z97.3

## 2023-06-20 HISTORY — DX: Unspecified osteoarthritis, unspecified site: M19.90

## 2023-06-20 HISTORY — DX: Postmenopausal bleeding: N95.0

## 2023-06-20 HISTORY — PX: DILATATION & CURETTAGE/HYSTEROSCOPY WITH MYOSURE: SHX6511

## 2023-06-20 HISTORY — DX: Type 2 diabetes mellitus without complications: E11.9

## 2023-06-20 HISTORY — DX: Gastro-esophageal reflux disease without esophagitis: K21.9

## 2023-06-20 HISTORY — DX: Chronic gout, unspecified, without tophus (tophi): M1A.9XX0

## 2023-06-20 HISTORY — DX: Complete loss of teeth, unspecified cause, unspecified class: K08.109

## 2023-06-20 HISTORY — DX: Polyp of corpus uteri: N84.0

## 2023-06-20 LAB — POCT I-STAT, CHEM 8
BUN: 11 mg/dL (ref 8–23)
Calcium, Ion: 1.21 mmol/L (ref 1.15–1.40)
Chloride: 100 mmol/L (ref 98–111)
Creatinine, Ser: 0.8 mg/dL (ref 0.44–1.00)
Glucose, Bld: 114 mg/dL — ABNORMAL HIGH (ref 70–99)
HCT: 42 % (ref 36.0–46.0)
Hemoglobin: 14.3 g/dL (ref 12.0–15.0)
Potassium: 3.9 mmol/L (ref 3.5–5.1)
Sodium: 140 mmol/L (ref 135–145)
TCO2: 29 mmol/L (ref 22–32)

## 2023-06-20 LAB — CBC
HCT: 41.7 % (ref 36.0–46.0)
Hemoglobin: 13.5 g/dL (ref 12.0–15.0)
MCH: 28.6 pg (ref 26.0–34.0)
MCHC: 32.4 g/dL (ref 30.0–36.0)
MCV: 88.3 fL (ref 80.0–100.0)
Platelets: 315 10*3/uL (ref 150–400)
RBC: 4.72 MIL/uL (ref 3.87–5.11)
RDW: 14.5 % (ref 11.5–15.5)
WBC: 12.5 10*3/uL — ABNORMAL HIGH (ref 4.0–10.5)
nRBC: 0 % (ref 0.0–0.2)

## 2023-06-20 LAB — GLUCOSE, CAPILLARY: Glucose-Capillary: 116 mg/dL — ABNORMAL HIGH (ref 70–99)

## 2023-06-20 SURGERY — DILATATION & CURETTAGE/HYSTEROSCOPY WITH MYOSURE
Anesthesia: General

## 2023-06-20 MED ORDER — ONDANSETRON HCL 4 MG/2ML IJ SOLN
INTRAMUSCULAR | Status: DC | PRN
  Administered 2023-06-20: 4 mg via INTRAVENOUS

## 2023-06-20 MED ORDER — ACETAMINOPHEN 500 MG PO TABS
1000.0000 mg | ORAL_TABLET | ORAL | Status: AC
  Administered 2023-06-20: 1000 mg via ORAL

## 2023-06-20 MED ORDER — FENTANYL CITRATE (PF) 100 MCG/2ML IJ SOLN
INTRAMUSCULAR | Status: AC
  Filled 2023-06-20: qty 2

## 2023-06-20 MED ORDER — POVIDONE-IODINE 10 % EX SWAB: 2.0000 | Freq: Once | CUTANEOUS | Status: DC

## 2023-06-20 MED ORDER — SODIUM CHLORIDE 0.9 % IR SOLN
Status: DC | PRN
  Administered 2023-06-20: 3000 mL

## 2023-06-20 MED ORDER — PROPOFOL 10 MG/ML IV BOLUS
INTRAVENOUS | Status: DC | PRN
  Administered 2023-06-20: 160 mg via INTRAVENOUS

## 2023-06-20 MED ORDER — LIDOCAINE HCL (CARDIAC) PF 100 MG/5ML IV SOSY
PREFILLED_SYRINGE | INTRAVENOUS | Status: DC | PRN
  Administered 2023-06-20: 50 mg via INTRAVENOUS

## 2023-06-20 MED ORDER — LACTATED RINGERS IV SOLN: INTRAVENOUS | Status: DC

## 2023-06-20 MED ORDER — CEFAZOLIN SODIUM 1 G IJ SOLR
INTRAMUSCULAR | Status: AC
  Filled 2023-06-20: qty 10

## 2023-06-20 MED ORDER — FENTANYL CITRATE (PF) 100 MCG/2ML IJ SOLN
INTRAMUSCULAR | Status: DC | PRN
  Administered 2023-06-20: 50 ug via INTRAVENOUS

## 2023-06-20 MED ORDER — KETOROLAC TROMETHAMINE 30 MG/ML IJ SOLN
INTRAMUSCULAR | Status: DC | PRN
  Administered 2023-06-20: 30 mg via INTRAVENOUS

## 2023-06-20 MED ORDER — DEXAMETHASONE SODIUM PHOSPHATE 4 MG/ML IJ SOLN
INTRAMUSCULAR | Status: DC | PRN
  Administered 2023-06-20: 5 mg via INTRAVENOUS

## 2023-06-20 MED ORDER — BUPIVACAINE HCL (PF) 0.25 % IJ SOLN
INTRAMUSCULAR | Status: DC | PRN
  Administered 2023-06-20: 20 mL

## 2023-06-20 MED ORDER — PHENYLEPHRINE HCL (PRESSORS) 10 MG/ML IV SOLN
INTRAVENOUS | Status: DC | PRN
  Administered 2023-06-20 (×3): 80 ug via INTRAVENOUS

## 2023-06-20 MED ORDER — IBUPROFEN 600 MG PO TABS: 600.0000 mg | ORAL_TABLET | Freq: Four times a day (QID) | ORAL | 0 refills | Status: AC | PRN

## 2023-06-20 MED ORDER — DEXMEDETOMIDINE HCL IN NACL 80 MCG/20ML IV SOLN
INTRAVENOUS | Status: DC | PRN
  Administered 2023-06-20 (×2): 4 ug via INTRAVENOUS

## 2023-06-20 MED ORDER — ACETAMINOPHEN 500 MG PO TABS
ORAL_TABLET | ORAL | Status: AC
  Filled 2023-06-20: qty 2

## 2023-06-20 SURGICAL SUPPLY — 16 items
CATH ROBINSON RED A/P 16FR (CATHETERS) IMPLANT
DEVICE MYOSURE LITE (MISCELLANEOUS) IMPLANT
DEVICE MYOSURE REACH (MISCELLANEOUS) IMPLANT
DRSG TELFA 3X8 NADH STRL (GAUZE/BANDAGES/DRESSINGS) ×2 IMPLANT
GAUZE 4X4 16PLY ~~LOC~~+RFID DBL (SPONGE) ×2 IMPLANT
GLOVE BIOGEL M 6.5 STRL (GLOVE) ×2 IMPLANT
GLOVE BIOGEL M 7.0 STRL (GLOVE) ×2 IMPLANT
GLOVE BIOGEL PI IND STRL 6.5 (GLOVE) ×4 IMPLANT
GLOVE BIOGEL PI IND STRL 7.0 (GLOVE) ×2 IMPLANT
GOWN STRL REUS W/TWL LRG LVL3 (GOWN DISPOSABLE) ×4 IMPLANT
KIT PROCEDURE FLUENT (KITS) ×2 IMPLANT
KIT TURNOVER CYSTO (KITS) ×2 IMPLANT
PACK VAGINAL MINOR WOMEN LF (CUSTOM PROCEDURE TRAY) ×2 IMPLANT
PAD OB MATERNITY 4.3X12.25 (PERSONAL CARE ITEMS) ×2 IMPLANT
SEAL ROD LENS SCOPE MYOSURE (ABLATOR) ×2 IMPLANT
SLEEVE SCD COMPRESS KNEE MED (STOCKING) ×2 IMPLANT

## 2023-06-20 NOTE — H&P (Signed)
Date of Initial H&P: 06/19/2023  History reviewed, patient examined, no change in status, stable for surgery.

## 2023-06-20 NOTE — Anesthesia Procedure Notes (Signed)
Procedure Name: LMA Insertion Date/Time: 06/20/2023 8:42 AM  Performed by: Earmon Phoenix, CRNAPre-anesthesia Checklist: Patient identified, Emergency Drugs available, Suction available, Patient being monitored and Timeout performed Patient Re-evaluated:Patient Re-evaluated prior to induction Oxygen Delivery Method: Circle system utilized Preoxygenation: Pre-oxygenation with 100% oxygen Induction Type: IV induction Ventilation: Mask ventilation without difficulty LMA Size: 4.0 Number of attempts: 1 Placement Confirmation: positive ETCO2 and breath sounds checked- equal and bilateral Tube secured with: Tape Dental Injury: Teeth and Oropharynx as per pre-operative assessment

## 2023-06-20 NOTE — Discharge Instructions (Addendum)
  Post Anesthesia Home Care Instructions  Activity: Get plenty of rest for the remainder of the day. A responsible individual must stay with you for 24 hours following the procedure.  For the next 24 hours, DO NOT: -Drive a car -Advertising copywriter -Drink alcoholic beverages -Take any medication unless instructed by your physician -Make any legal decisions or sign important papers.  Meals: Start with liquid foods such as gelatin or soup. Progress to regular foods as tolerated. Avoid greasy, spicy, heavy foods. If nausea and/or vomiting occur, drink only clear liquids until the nausea and/or vomiting subsides. Call your physician if vomiting continues.  Special Instructions/Symptoms: Your throat may feel dry or sore from the anesthesia or the breathing tube placed in your throat during surgery. If this causes discomfort, gargle with warm salt water. The discomfort should disappear within 24 hours.        D & C Home care Instructions:   Personal hygiene:  Used sanitary napkins for vaginal drainage not tampons. Shower or tub bathe the day after your procedure. No douching until bleeding stops. Always wipe from front to back after  Elimination.  Activity: Do not drive or operate any equipment today. The effects of the anesthesia are still present and drowsiness may result. Rest today, not necessarily flat bed rest, just take it easy. You may resume your normal activity in one to 2 days.  Sexual activity: No intercourse for one week or as indicated by your physician  Diet: Eat a light diet as desired this evening. You may resume a regular diet tomorrow.  Return to work: One to 2 days.  General Expectations of your surgery: Vaginal bleeding should be no heavier than a normal period. Spotting may continue up to 10 days. Mild cramps may continue for a couple of days. You may have a regular period in 2-6 weeks.  Unexpected observations call your doctor if these occur: persistent or heavy  bleeding. Severe abdominal cramping or pain. Elevation of temperature greater than 100F.  Call for an appointment in two weeks.  No acetaminophen/Tylenol until after 12:45 pm today if needed. No ibuprofen, Advil, Aleve, Motrin, ketorolac, meloxicam, naproxen, or other NSAIDS until after 3:30 pm today if needed.

## 2023-06-20 NOTE — Transfer of Care (Signed)
Immediate Anesthesia Transfer of Care Note  Patient: Tracy Byrd  Procedure(s) Performed: DILATATION & CURETTAGE/HYSTEROSCOPY POLYPECTOMY WITH MYOSURE  Patient Location: PACU  Anesthesia Type:General  Level of Consciousness: drowsy and patient cooperative  Airway & Oxygen Therapy: Patient Spontanous Breathing and Patient connected to nasal cannula oxygen  Post-op Assessment: Report given to RN and Post -op Vital signs reviewed and stable  Post vital signs: Reviewed and stable  Last Vitals:  Vitals Value Taken Time  BP 139/73 06/20/23 0940  Temp    Pulse 85 06/20/23 0942  Resp 14 06/20/23 0942  SpO2 99 % 06/20/23 0942  Vitals shown include unfiled device data.  Last Pain:  Vitals:   06/20/23 0705  TempSrc: Oral  PainSc: 0-No pain      Patients Stated Pain Goal: 5 (06/20/23 0705)  Complications: No notable events documented.

## 2023-06-20 NOTE — Anesthesia Postprocedure Evaluation (Signed)
Anesthesia Post Note  Patient: Tracy Byrd  Procedure(s) Performed: DILATATION & CURETTAGE/HYSTEROSCOPY POLYPECTOMY WITH MYOSURE     Patient location during evaluation: PACU Anesthesia Type: General Level of consciousness: awake and alert Pain management: pain level controlled Vital Signs Assessment: post-procedure vital signs reviewed and stable Respiratory status: spontaneous breathing, nonlabored ventilation and respiratory function stable Cardiovascular status: stable and blood pressure returned to baseline Anesthetic complications: no   No notable events documented.  Last Vitals:  Vitals:   06/20/23 1015 06/20/23 1040  BP: (!) 144/63 (!) 140/71  Pulse: 71 76  Resp: 12 14  Temp:  (!) 36.2 C  SpO2: 97% 95%    Last Pain:  Vitals:   06/20/23 1040  TempSrc:   PainSc: 0-No pain                 Beryle Lathe

## 2023-06-20 NOTE — Op Note (Signed)
06/20/2023  9:53 AM  PATIENT:  Tracy Byrd  67 y.o. female  PRE-OPERATIVE DIAGNOSIS:  Endometrial Polyp Postmenopausal Bleeding  POST-OPERATIVE DIAGNOSIS:  Endometrial PolypPostmenopausal Bleeding  PROCEDURE:  Procedure(s): DILATATION & CURETTAGE/HYSTEROSCOPY POLYPECTOMY WITH MYOSURE (N/A)  SURGEON:  Surgeons and Role:    Gerald Leitz, MD - Primary  PHYSICIAN ASSISTANT:   ASSISTANTS: none   ANESTHESIA:   general  EBL:  5 mL   BLOOD ADMINISTERED:none    DRAINS: none   LOCAL MEDICATIONS USED:  MARCAINE     SPECIMEN:  Source of Specimen:  endometrial curettings and polyp  DISPOSITION OF SPECIMEN:  PATHOLOGY  COUNTS:  YES  TOURNIQUET:  * No tourniquets in log *  DICTATION: .Note written in EPIC  PLAN OF CARE: Discharge to home after PACU  PATIENT DISPOSITION:  PACU - hemodynamically stable.   Delay start of Pharmacological VTE agent (>24hrs) due to surgical blood loss or risk of bleeding: not applicable  Finding: normal appearing external genitalia normal appearing vaginal mucosa.. normal appearing cervix. Endometrial polyps noted.   Procedure: Patient was taken to the operating room where she was placed under general anesthesia. She was placed in the dorsal lithotomy position. She was prepped and draped in the usual sterile fashion. A speculum was placed into the vaginal vault. The anterior lip of the cervix was grasped with a single-tooth tenaculum. Quarter percent Marcaine was injected at the 4 and 8:00 positions of the cervix. The cervix was then sounded to 12 cm. The cervix was dilated to approximately 6 mm. Mysosure operative  hysteroscope was inserted. The findings noted above. Myosure reach  blade was introduced throught the hysteroscope. The endometrial mass was removed in 5 minute.  There was no evidence of perforation. Hysteroscope was then removed. Sharp curette was inserted and endometrial curettings were obtained. The hysteroscope was reinserted. There was  no sign of uterine  perforation. .The hysteroscope was removed.  The single-tooth tenaculum was removed from the anterior lip of the cervix. Excellent hemostasis was noted. The speculum was removed from the patient's vagina. She was awakened from anesthesia taken care  To the recovery  room awake and in stable condition. Sponge lap and needle counts were correct x 2.

## 2023-06-21 ENCOUNTER — Encounter (HOSPITAL_BASED_OUTPATIENT_CLINIC_OR_DEPARTMENT_OTHER): Payer: Self-pay | Admitting: Obstetrics and Gynecology

## 2023-06-21 LAB — SURGICAL PATHOLOGY
